# Patient Record
Sex: Male | Born: 1960 | Race: Black or African American | Hispanic: No | Marital: Single | Smoking: Former smoker
Health system: Southern US, Community
[De-identification: ages and names within clinical notes are randomized; demographics above are authoritative.]

## PROBLEM LIST (undated history)

## (undated) DIAGNOSIS — E119 Type 2 diabetes mellitus without complications: Secondary | ICD-10-CM

## (undated) DIAGNOSIS — I10 Essential (primary) hypertension: Secondary | ICD-10-CM

## (undated) DIAGNOSIS — F419 Anxiety disorder, unspecified: Secondary | ICD-10-CM

## (undated) DIAGNOSIS — E785 Hyperlipidemia, unspecified: Secondary | ICD-10-CM

## (undated) DIAGNOSIS — N529 Male erectile dysfunction, unspecified: Secondary | ICD-10-CM

## (undated) HISTORY — PX: NO PAST SURGERIES: SHX2092

## (undated) HISTORY — DX: Essential (primary) hypertension: I10

## (undated) HISTORY — DX: Type 2 diabetes mellitus without complications: E11.9

## (undated) HISTORY — DX: Hyperlipidemia, unspecified: E78.5

## (undated) HISTORY — DX: Anxiety disorder, unspecified: F41.9

## (undated) HISTORY — DX: Male erectile dysfunction, unspecified: N52.9

---

## 2005-08-30 ENCOUNTER — Ambulatory Visit: Payer: Self-pay | Admitting: Family Medicine

## 2006-07-29 ENCOUNTER — Ambulatory Visit: Payer: Self-pay | Admitting: Nurse Practitioner

## 2011-05-16 ENCOUNTER — Emergency Department: Payer: Self-pay | Admitting: Emergency Medicine

## 2012-05-11 ENCOUNTER — Emergency Department: Payer: Self-pay | Admitting: Emergency Medicine

## 2012-05-11 LAB — BASIC METABOLIC PANEL
Anion Gap: 8 (ref 7–16)
BUN: 25 mg/dL — ABNORMAL HIGH (ref 7–18)
Calcium, Total: 9.7 mg/dL (ref 8.5–10.1)
Co2: 26 mmol/L (ref 21–32)
EGFR (African American): >60
EGFR (Non-African Amer.): 59 — ABNORMAL LOW
Glucose: 361 mg/dL — ABNORMAL HIGH (ref 65–99)
Osmolality: 284 (ref 275–301)
Potassium: 3.7 mmol/L (ref 3.5–5.1)

## 2012-05-11 LAB — CBC
HGB: 17.9 g/dL (ref 13.0–18.0)
MCV: 91 fL (ref 80–100)
Platelet: 300 10*3/uL (ref 150–440)
RBC: 5.77 10*6/uL (ref 4.40–5.90)
RDW: 14.1 % (ref 11.5–14.5)
WBC: 7.3 10*3/uL (ref 3.8–10.6)

## 2012-05-24 DIAGNOSIS — K219 Gastro-esophageal reflux disease without esophagitis: Secondary | ICD-10-CM | POA: Insufficient documentation

## 2012-08-24 DIAGNOSIS — F5221 Male erectile disorder: Secondary | ICD-10-CM | POA: Insufficient documentation

## 2012-08-24 DIAGNOSIS — E1142 Type 2 diabetes mellitus with diabetic polyneuropathy: Secondary | ICD-10-CM | POA: Insufficient documentation

## 2013-01-18 DIAGNOSIS — R32 Unspecified urinary incontinence: Secondary | ICD-10-CM | POA: Insufficient documentation

## 2013-01-18 DIAGNOSIS — N529 Male erectile dysfunction, unspecified: Secondary | ICD-10-CM | POA: Insufficient documentation

## 2013-02-22 DIAGNOSIS — R5383 Other fatigue: Secondary | ICD-10-CM | POA: Insufficient documentation

## 2013-02-22 DIAGNOSIS — R5381 Other malaise: Secondary | ICD-10-CM | POA: Insufficient documentation

## 2013-03-10 IMAGING — CR DG CHEST 2V
1 series · 2 of 2 positions shown · non-contrast
Comparison: none

REASON FOR EXAM: dizziness
COMMENTS:

PROCEDURE:     DXR - DXR CHEST PA (OR AP) AND LATERAL  - May 16, 2011  [DATE]
RESULT:     No acute cardiac or pulmonary disease.

[Series 1: view not recorded · 0.17mm/px · 2 of 2 slices shown]
[im 1/2]
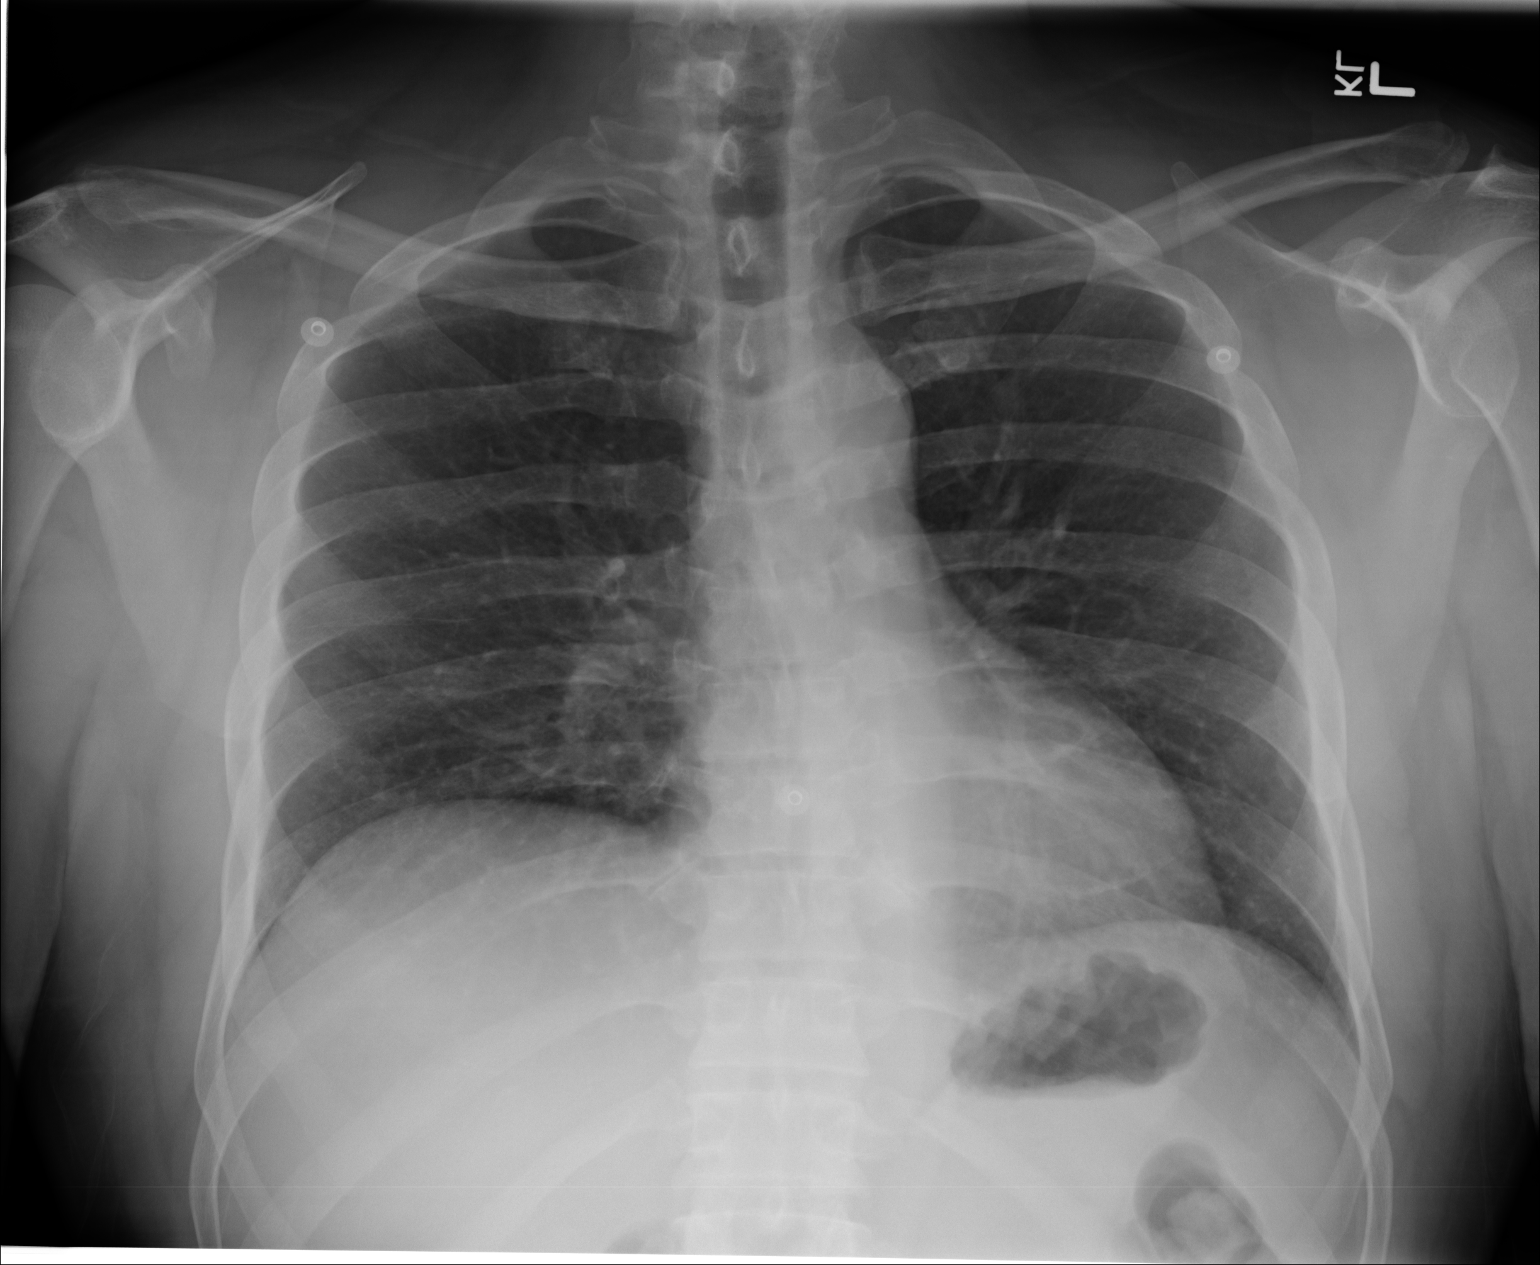
[im 2/2]
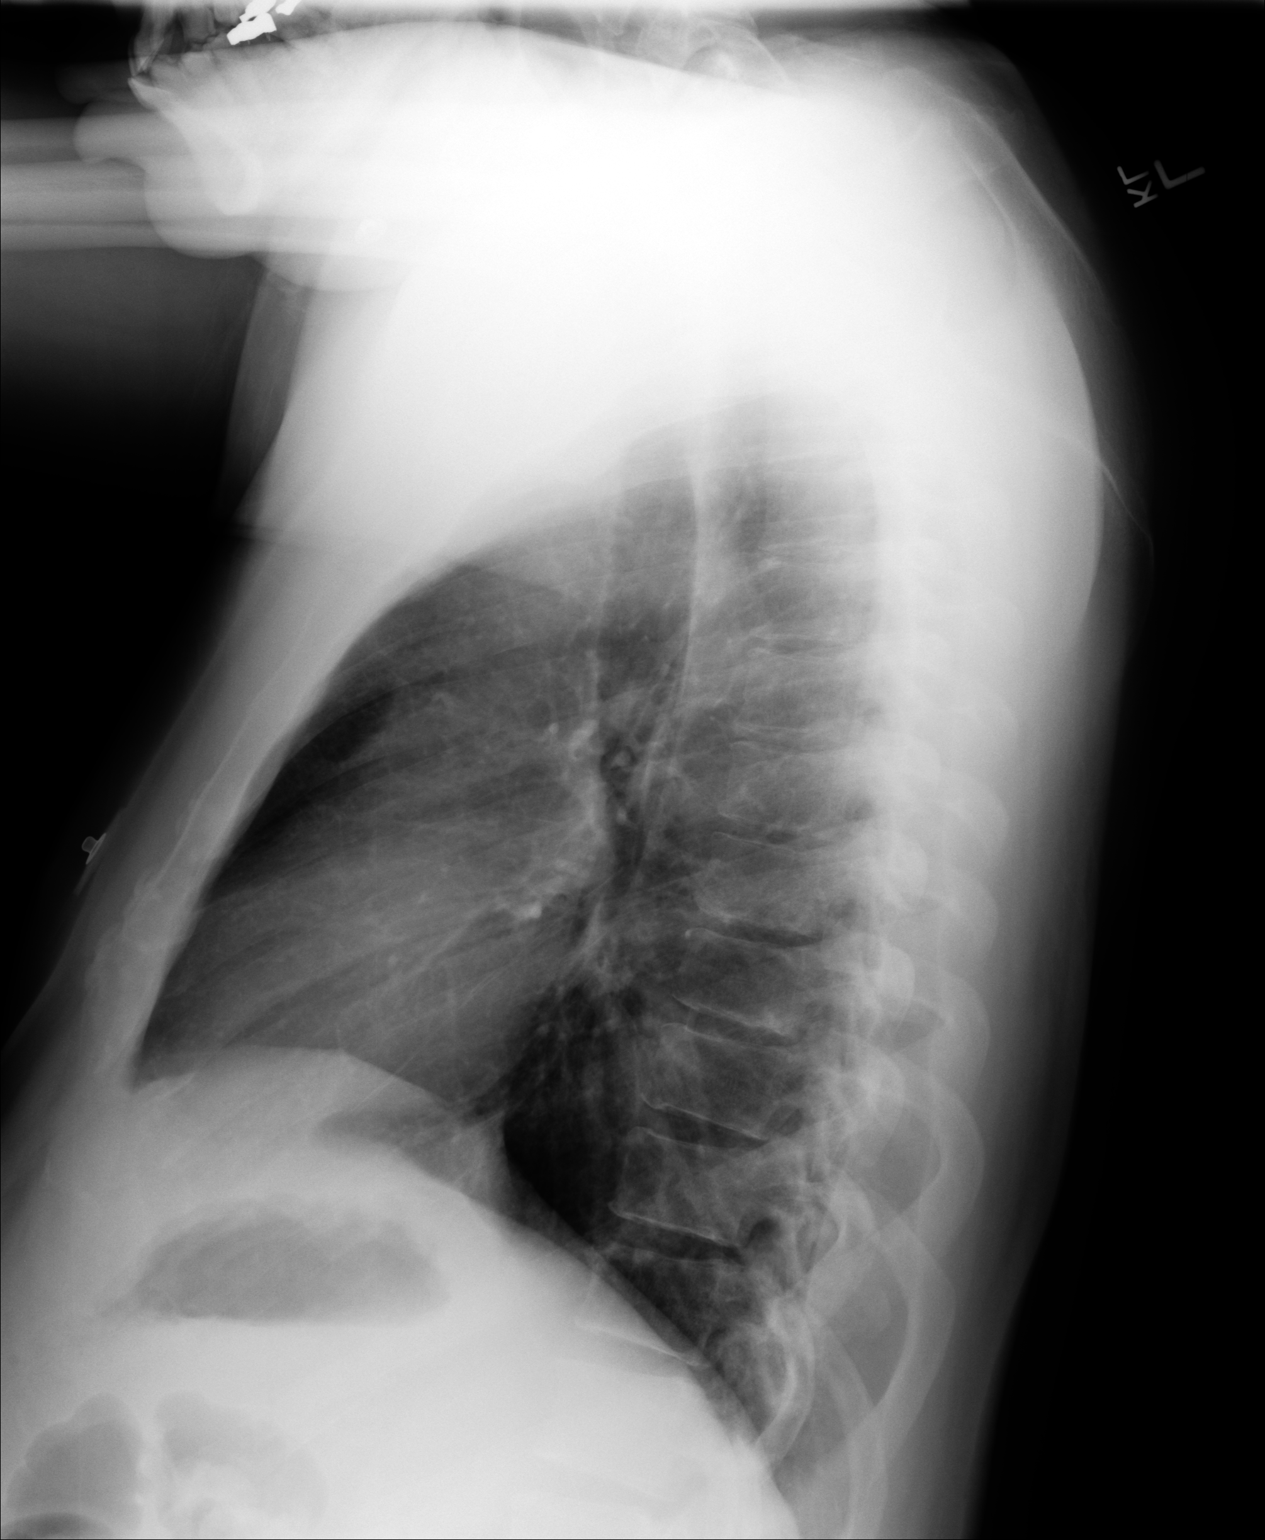

[2 of 2 positions shown; findings below may reference images not displayed]

IMPRESSION: No acute disease.

## 2013-07-06 DIAGNOSIS — E11319 Type 2 diabetes mellitus with unspecified diabetic retinopathy without macular edema: Secondary | ICD-10-CM | POA: Insufficient documentation

## 2013-07-06 DIAGNOSIS — E113299 Type 2 diabetes mellitus with mild nonproliferative diabetic retinopathy without macular edema, unspecified eye: Secondary | ICD-10-CM | POA: Insufficient documentation

## 2014-02-07 DIAGNOSIS — B351 Tinea unguium: Secondary | ICD-10-CM | POA: Insufficient documentation

## 2014-02-07 DIAGNOSIS — M549 Dorsalgia, unspecified: Secondary | ICD-10-CM | POA: Insufficient documentation

## 2016-11-19 ENCOUNTER — Ambulatory Visit (INDEPENDENT_AMBULATORY_CARE_PROVIDER_SITE_OTHER): Payer: Medicare Other | Admitting: Urology

## 2016-11-19 ENCOUNTER — Encounter: Payer: Self-pay | Admitting: Urology

## 2016-11-19 ENCOUNTER — Ambulatory Visit: Payer: Medicare Other | Admitting: Urology

## 2016-11-19 VITALS — BP 150/84 | HR 94 | Ht 69.5 in | Wt 205.3 lb

## 2016-11-19 DIAGNOSIS — N138 Other obstructive and reflux uropathy: Secondary | ICD-10-CM

## 2016-11-19 DIAGNOSIS — N529 Male erectile dysfunction, unspecified: Secondary | ICD-10-CM

## 2016-11-19 DIAGNOSIS — N401 Enlarged prostate with lower urinary tract symptoms: Secondary | ICD-10-CM

## 2016-11-19 DIAGNOSIS — Z125 Encounter for screening for malignant neoplasm of prostate: Secondary | ICD-10-CM

## 2016-11-19 MED ORDER — SILDENAFIL CITRATE 20 MG PO TABS: ORAL_TABLET | ORAL | 3 refills | Status: DC

## 2016-11-19 NOTE — Progress Notes (Signed)
11/19/2016 4:32 PM   Hollie Beach Dec 11, 1960 [id number]  Referring provider: Leanna Sato, MD [address] [place], [place] [address]  Chief Complaint  Patient presents with  . Erectile Dysfunction    HPI: Patient is a 56 year old African American male who is referred to [place] by Dr. Darreld Mclean for erectile dysfunction.  His SHIM score is 5, which is severe ED.  He has been having difficulty with erections for 2 years.   His major complaint is not having erections.  His libido is preserved.   His risk factors for ED are age, BPH, DM, HTN, HLD and blood pressure medications. He denies any painful erections or curvatures with his erections.   He is no longer having spontaneous erections.  He has tried Viagra in the past and found it ineffective.     SHIM    Row Name 11/19/16 1620         SHIM: Over the last 6 months:   How do you rate your confidence that you could get and keep an erection? Very Low     When you had erections with sexual stimulation, how often were your erections hard enough for penetration (entering your partner)? Almost Never or Never     During sexual intercourse, how often were you able to maintain your erection after you had penetrated (entered) your partner? Extremely Difficult     During sexual intercourse, how difficult was it to maintain your erection to completion of intercourse? Extremely Difficult     When you attempted sexual intercourse, how often was it satisfactory for you? Extremely Difficult       SHIM Total Score   SHIM 5        Score: 1-7 Severe ED 8-11 Moderate ED 12-16 Mild-Moderate ED 17-21 Mild ED 22-25 No ED  His IPSS score today is 6, which is mild lower urinary tract symptomatology. He is pleased with his quality life due to his urinary symptoms.  His major complaints today are frequency, urgency and nocturia x 2.  He has had these symptoms for a few years.  He denies any dysuria, hematuria or suprapubic pain.   He also  denies any recent fevers, chills, nausea or vomiting.  He does not have a family history of PCa.     IPSS    Row Name 11/19/16 1600         International Prostate Symptom Score   How often have you had the sensation of not emptying your bladder? Not at All     How often have you had to urinate less than every two hours? Less than 1 in 5 times     How often have you found you stopped and started again several times when you urinated? Not at All     How often have you found it difficult to postpone urination? Less than half the time     How often have you had a weak urinary stream? Less than 1 in 5 times     How often have you had to strain to start urination? Not at All     How many times did you typically get up at night to urinate? 2 Times     Total IPSS Score 6       Quality of Life due to urinary symptoms   If you were to spend the rest of your life with your urinary condition just the way it is now how would you  feel about that? Pleased        Score:  1-7 Mild 8-19 Moderate 20-35 Severe  PMH: Past Medical History:  Diagnosis Date  . Anxiety   . Diabetes mellitus without complication (HCC)   . Hyperlipidemia   . Hypertension     Surgical History: History reviewed. No pertinent surgical history.  Home Medications:  Allergies as of 11/19/2016   No Known Allergies     Medication List       Accurate as of 11/19/16  4:32 PM. Always use your most recent med list.          atorvastatin 40 MG tablet Commonly known as:  LIPITOR   gabapentin 300 MG capsule Commonly known as:  NEURONTIN   hydrochlorothiazide 25 MG tablet Commonly known as:  HYDRODIURIL   LANTUS SOLOSTAR 100 UNIT/ML Solostar Pen Generic drug:  Insulin Glargine   latanoprost 0.005 % ophthalmic solution Commonly known as:  XALATAN   lisinopril 5 MG tablet Commonly known as:  PRINIVIL,ZESTRIL   RELION PEN NEEDLES 31G X 6 MM Misc Generic drug:  Insulin Pen Needle   sildenafil 20 MG  tablet Commonly known as:  REVATIO Take 3 to 5 tablets two hours before intercouse on an empty stomach.  Do not take with nitrates.   triamcinolone cream 0.1 % Commonly known as:  KENALOG       Allergies: No Known Allergies  Family History: Family History  Problem Relation Age of Onset  . Cancer Father     Lung  . Cancer Mother     spinal  . Prostate cancer Neg Hx   . Bladder Cancer Neg Hx   . Kidney cancer Neg Hx     Social History:  reports that he has quit smoking. He has never used smokeless tobacco. He reports that he drinks alcohol. He reports that he does not use drugs.  ROS: UROLOGY Frequent Urination?: Yes Hard to postpone urination?: Yes Burning/pain with urination?: No Get up at night to urinate?: Yes Leakage of urine?: No Urine stream starts and stops?: No Trouble starting stream?: No Do you have to strain to urinate?: No Blood in urine?: No Urinary tract infection?: No Sexually transmitted disease?: No Injury to kidneys or bladder?: No Painful intercourse?: No Weak stream?: No Erection problems?: Yes Penile pain?: No  Gastrointestinal Nausea?: No Vomiting?: No Indigestion/heartburn?: No Diarrhea?: No Constipation?: No  Constitutional Fever: No Night sweats?: No Weight loss?: No Fatigue?: No  Skin Skin rash/lesions?: No Itching?: Yes  Eyes Blurred vision?: Yes Double vision?: No  Ears/Nose/Throat Sore throat?: No Sinus problems?: No  Hematologic/Lymphatic Swollen glands?: No Easy bruising?: No  Cardiovascular Leg swelling?: No Chest pain?: No  Respiratory Cough?: No Shortness of breath?: Yes  Endocrine Excessive thirst?: No  Musculoskeletal Back pain?: Yes Joint pain?: No  Neurological Headaches?: No Dizziness?: No  Psychologic Depression?: No Anxiety?: Yes  Physical Exam: BP (!) 150/84 (BP Location: Left Arm, Patient Position: Sitting, Cuff Size: Normal)   Pulse 94   Ht 5' 9.5" (1.765 m)   Wt 205 lb 4.8  oz (93.1 kg)   BMI 29.88 kg/m   Constitutional: Well nourished. Alert and oriented, No acute distress. HEENT: [place] AT, moist mucus membranes. Trachea midline, no masses. Cardiovascular: No clubbing, cyanosis, or edema. Respiratory: Normal respiratory effort, no increased work of breathing. GI: Abdomen is soft, non tender, non distended, no abdominal masses. Liver and spleen not palpable.  No hernias appreciated.  Stool sample for occult testing is not indicated.  GU: No CVA tenderness.  No bladder fullness or masses.  Patient with circumcised phallus.  Urethral meatus is patent.  No penile discharge. No penile lesions or rashes. Scrotum without lesions, cysts, rashes and/or edema.  Testicles are located scrotally bilaterally. No masses are appreciated in the testicles. Left and right epididymis are normal. Rectal: Patient with  normal sphincter tone. Anus and perineum without scarring or rashes. No rectal masses are appreciated. Prostate is approximately 50 grams, no nodules are appreciated. Seminal vesicles are normal. Skin: No rashes, bruises or suspicious lesions. Lymph: No cervical or inguinal adenopathy. Neurologic: Grossly intact, no focal deficits, moving all 4 extremities. Psychiatric: Normal mood and affect.  Laboratory Data: PSA History   0.3 ng/mL on 11/04/2016  Lab Results  Component Value Date   WBC 7.3 05/11/2012   HGB 17.9 05/11/2012   HCT 52.4 (H) 05/11/2012   MCV 91 05/11/2012   PLT 300 05/11/2012    Lab Results  Component Value Date   CREATININE 1.36 (H) 05/11/2012    Assessment & Plan:    1. Erectile dysfunction  - SHIM score is 5  - I explained to the patient that in order to achieve an erection it takes good functioning of the nervous system (parasympathetic, sympathetic, sensory and motor), good blood flow into the erectile tissue of the penis and a desire to have sex  - I explained that conditions like diabetes, hypertension, coronary artery disease,  peripheral vascular disease, smoking, alcohol consumption, age, sleep apnea and BPH can diminish the ability to have an erection  - We discussed trying a different PDE5 inhibitor, intra-urethral suppositories, intracavernous vasoactive drug injection therapy, vacuum constriction device and penile prosthesis implantation  - He would like to retry PDE5-inhibitors - script given for sildenafil 20 mg, 3 to 5 tablets on an empty stomach two hours prior to intercourse, # 50  - RTC in one month for repeat SHIM score  2. BPH with LUTS  - IPSS score is 6/1  - Continue conservative management, avoiding bladder irritants and timed voiding's  - most bothersome symptoms are frequency, urgency and nocturia  - RTC in 12 months for IPSS, PSA and exam   3. PSA screening  - PSA screening on a yearly basis is appropriate in his age group  Return in about 1 month (around 12/17/2016) for SHIM .  These notes generated with voice recognition software. I apologize for typographical errors.  Michiel Cowboy, PA-C  [facility] [address] [place], [place] [address] [phone]

## 2016-12-18 ENCOUNTER — Ambulatory Visit: Payer: Medicare Other | Admitting: Urology

## 2018-01-01 DIAGNOSIS — M62838 Other muscle spasm: Secondary | ICD-10-CM | POA: Insufficient documentation

## 2018-01-29 DIAGNOSIS — L84 Corns and callosities: Secondary | ICD-10-CM | POA: Insufficient documentation

## 2018-09-08 DIAGNOSIS — M109 Gout, unspecified: Secondary | ICD-10-CM | POA: Insufficient documentation

## 2018-10-06 ENCOUNTER — Encounter: Payer: Self-pay | Admitting: Family Medicine

## 2018-10-07 ENCOUNTER — Telehealth: Payer: Self-pay

## 2018-10-07 NOTE — Telephone Encounter (Signed)
Pt left vm to schedule procedure  °

## 2018-10-08 ENCOUNTER — Other Ambulatory Visit: Payer: Self-pay

## 2018-10-08 DIAGNOSIS — Z1211 Encounter for screening for malignant neoplasm of colon: Secondary | ICD-10-CM

## 2018-10-08 NOTE — Telephone Encounter (Signed)
Colonoscopy has been scheduled for 10/28/18 with Dr. Tobi Bastos at [facility].  Thanks [employer]

## 2018-10-14 ENCOUNTER — Ambulatory Visit (INDEPENDENT_AMBULATORY_CARE_PROVIDER_SITE_OTHER): Payer: Medicare Other | Admitting: Podiatry

## 2018-10-14 ENCOUNTER — Encounter: Payer: Self-pay | Admitting: Podiatry

## 2018-10-14 DIAGNOSIS — E1142 Type 2 diabetes mellitus with diabetic polyneuropathy: Secondary | ICD-10-CM

## 2018-10-14 DIAGNOSIS — Q828 Other specified congenital malformations of skin: Secondary | ICD-10-CM

## 2018-10-14 DIAGNOSIS — M205X9 Other deformities of toe(s) (acquired), unspecified foot: Secondary | ICD-10-CM | POA: Diagnosis not present

## 2018-10-14 DIAGNOSIS — M79674 Pain in right toe(s): Secondary | ICD-10-CM | POA: Diagnosis not present

## 2018-10-14 DIAGNOSIS — M79675 Pain in left toe(s): Secondary | ICD-10-CM

## 2018-10-14 DIAGNOSIS — B351 Tinea unguium: Secondary | ICD-10-CM

## 2018-10-14 NOTE — Progress Notes (Addendum)
This patient presents to the office with chief complaint of long thick nails and diabetic feet. He also has a callus on his right foot which he applied acid to remove his callus.  This patient  says there  is  no pain and discomfort in their feet.  This patient says there are long thick painful nails.  These nails are painful walking and wearing shoes.  Patient has no history of infection or drainage from both feet.  Patient is unable to  self treat his own nails . This patient presents  to the office today for treatment of the  long nails, callus  and a foot evaluation due to history of  Diabetes. Patient has neuropathy. Patient has been under treatment for gout for the last 3 weeks.  General Appearance  Alert, conversant and in no acute stress.  Vascular  Dorsalis pedis and posterior tibial  pulses are weakly  palpable  bilaterally.  Capillary return is within normal limits  bilaterally. Temperature is within normal limits  bilaterally.  Neurologic  Senn-Weinstein monofilament wire test diminished   bilaterally. Muscle power within normal limits bilaterally.  Nails Thick disfigured discolored nails with subungual debris  from hallux to fifth toes bilaterally. No evidence of bacterial infection or drainage bilaterally.  Orthopedic  Limitation of motion 1st MPJ  B/L.Marland Kitchen  No crepitus or effusions noted.  Hallux limitus 1st MPJ  B/L.  Adductovarus fifth digit  B/L.    Skin  normotropic skin  noted bilaterally.  Callus on dorsolateral aspect right foot at the level 5th MPJ.  No signs of infections or ulcers noted.     Onychomycosis  Diabetes with no angiopathy and neuropathy.  IE  Debride nails x 10.  A diabetic foot exam was performed and there is evidence of  vascular or neurologic pathology.  Recommended pumice stones for calluses.   RTC 3 months.   Helane Gunther DPM

## 2018-10-15 ENCOUNTER — Encounter: Payer: Self-pay | Admitting: Podiatry

## 2018-10-15 NOTE — Telephone Encounter (Signed)
Dr. Stacie Acres, I reviewed your note and it doesn't state anything about arthritis, nor gout.  Im not sure if this was discussed during your visit.  Will you please call him or let me now what to tell him and I will call.   Thanks!

## 2018-10-20 ENCOUNTER — Other Ambulatory Visit (INDEPENDENT_AMBULATORY_CARE_PROVIDER_SITE_OTHER): Payer: Self-pay | Admitting: Vascular Surgery

## 2018-10-20 DIAGNOSIS — I739 Peripheral vascular disease, unspecified: Secondary | ICD-10-CM

## 2018-10-21 ENCOUNTER — Ambulatory Visit (INDEPENDENT_AMBULATORY_CARE_PROVIDER_SITE_OTHER): Payer: Medicare Other | Admitting: Vascular Surgery

## 2018-10-21 ENCOUNTER — Encounter (INDEPENDENT_AMBULATORY_CARE_PROVIDER_SITE_OTHER): Payer: Self-pay | Admitting: Vascular Surgery

## 2018-10-21 ENCOUNTER — Ambulatory Visit (INDEPENDENT_AMBULATORY_CARE_PROVIDER_SITE_OTHER): Payer: Medicare Other

## 2018-10-21 DIAGNOSIS — I739 Peripheral vascular disease, unspecified: Secondary | ICD-10-CM | POA: Diagnosis not present

## 2018-10-21 DIAGNOSIS — E1151 Type 2 diabetes mellitus with diabetic peripheral angiopathy without gangrene: Secondary | ICD-10-CM

## 2018-10-21 DIAGNOSIS — M79606 Pain in leg, unspecified: Secondary | ICD-10-CM | POA: Insufficient documentation

## 2018-10-21 DIAGNOSIS — Z794 Long term (current) use of insulin: Secondary | ICD-10-CM

## 2018-10-21 DIAGNOSIS — E782 Mixed hyperlipidemia: Secondary | ICD-10-CM

## 2018-10-21 DIAGNOSIS — M79604 Pain in right leg: Secondary | ICD-10-CM | POA: Diagnosis not present

## 2018-10-21 DIAGNOSIS — M79605 Pain in left leg: Secondary | ICD-10-CM | POA: Diagnosis not present

## 2018-10-21 DIAGNOSIS — E785 Hyperlipidemia, unspecified: Secondary | ICD-10-CM | POA: Insufficient documentation

## 2018-10-21 DIAGNOSIS — I10 Essential (primary) hypertension: Secondary | ICD-10-CM | POA: Insufficient documentation

## 2018-10-21 DIAGNOSIS — E119 Type 2 diabetes mellitus without complications: Secondary | ICD-10-CM | POA: Insufficient documentation

## 2018-10-21 NOTE — Progress Notes (Signed)
MRN : [id number]  Marcus [name] is a 58 y.o. (1961/08/25) male who presents with chief complaint of  Chief Complaint  Patient presents with  . Establish Care  .  History of Present Illness:    The patient is seen for evaluation of painful lower extremities and diminished pulses. Patient notes the pain is always associated with activity and is very consistent day today. Typically, the pain occurs at less than one block, progress is as activity continues to the point that the patient must stop walking. Resting including standing still for several minutes allowed resumption of the activity and the ability to walk a similar distance before stopping again. Uneven terrain and inclined shorten the distance. The pain has been progressive over the past several years. The patient states his difficulty walking is not having a profound negative impact on quality of life and his daily activities at this time.  The patient denies rest pain or dangling of an extremity off the side of the bed during the night for relief. No open wounds or sores at this time. No prior interventions or surgeries.  No history of back problems or DJD of the lumbar sacral spine.   The patient denies changes in claudication symptoms or new rest pain symptoms.  No new ulcers or wounds of the foot.  The patient's blood pressure has been stable and relatively well controlled. The patient denies amaurosis fugax or recent TIA symptoms. There are no recent neurological changes noted. The patient denies history of DVT, PE or superficial thrombophlebitis. The patient denies recent episodes of angina or shortness of breath.   ABI Rt=1.00 and Lt=0.81 Doppler signals are biphasic bilaterally  Current Meds  Medication Sig  . ACCU-CHEK AVIVA PLUS test strip USE STRIP TO CHECK GLUCOSE THREE TIMES DAILY  . ACCU-CHEK FASTCLIX LANCETS MISC USE TO CHECK BLOOD SUGAR THREE TIMES DAILY  . allopurinol (ZYLOPRIM) 100 MG tablet TAKE 1  TABLET BY MOUTH ONCE DAILY TO PREVENT GOUT  . aspirin EC 81 MG tablet Take by mouth.  Marland Kitchen atorvastatin (LIPITOR) 40 MG tablet Take 40 mg by mouth daily at 6 PM.   . furosemide (LASIX) 20 MG tablet TAKE 1 TABLET BY MOUTH IN THE MORNING FOR HIGH BLOOD PRESSURE. TAKE IN PLACE OF HCTZ.  . gabapentin (NEURONTIN) 300 MG capsule Take 300 mg by mouth as needed.   Marland Kitchen LANTUS SOLOSTAR 100 UNIT/ML Solostar Pen Inject into the skin. 31 units  . latanoprost (XALATAN) 0.005 % ophthalmic solution   . lisinopril (PRINIVIL,ZESTRIL) 5 MG tablet   . RELION PEN NEEDLES 31G X 6 MM MISC   . TRESIBA FLEXTOUCH 100 UNIT/ML SOPN FlexTouch Pen   . triamcinolone cream (KENALOG) 0.1 %     Past Medical History:  Diagnosis Date  . Anxiety   . Diabetes mellitus without complication (HCC)   . Hyperlipidemia   . Hypertension     No past surgical history on file.  Social History Social History   Tobacco Use  . Smoking status: Former Games developer  . Smokeless tobacco: Never Used  Substance Use Topics  . Alcohol use: Yes  . Drug use: No    Family History Family History  Problem Relation Age of Onset  . Cancer Father        Lung  . Cancer Mother        spinal  . Prostate cancer Neg Hx   . Bladder Cancer Neg Hx   . Kidney cancer Neg Hx   No  family history of bleeding/clotting disorders, porphyria or autoimmune disease   No Known Allergies   REVIEW OF SYSTEMS (Negative unless checked)  Constitutional: [] Weight loss  [] Fever  [] Chills Cardiac: [] Chest pain   [] Chest pressure   [] Palpitations   [] Shortness of breath when laying flat   [] Shortness of breath with exertion. Vascular:  [x] Pain in legs with walking   [] Pain in legs at rest  [] History of DVT   [] Phlebitis   [] Swelling in legs   [] Varicose veins   [] Non-healing ulcers Pulmonary:   [] Uses home oxygen   [] Productive cough   [] Hemoptysis   [] Wheeze  [] COPD   [] Asthma Neurologic:  [] Dizziness   [] Seizures   [] History of stroke   [] History of TIA  [] Aphasia    [] Vissual changes   [] Weakness or numbness in arm   [] Weakness or numbness in leg Musculoskeletal:   [] Joint swelling   [] Joint pain   [] Low back pain Hematologic:  [] Easy bruising  [] Easy bleeding   [] Hypercoagulable state   [] Anemic Gastrointestinal:  [] Diarrhea   [] Vomiting  [] Gastroesophageal reflux/heartburn   [] Difficulty swallowing. Genitourinary:  [] Chronic kidney disease   [] Difficult urination  [] Frequent urination   [] Blood in urine Skin:  [] Rashes   [] Ulcers  Psychological:  [] History of anxiety   []  History of major depression.  Physical Examination  Vitals:   10/21/18 0949  BP: (!) 144/84  Pulse: 92  Resp: 16  Weight: 206 lb 9.6 oz (93.7 kg)  Height: 5' 9.5" (1.765 m)   Body mass index is 30.07 kg/m. Gen: WD/WN, NAD Head: [place]/AT, No temporalis wasting.  Ear/Nose/Throat: Hearing grossly intact, nares w/o erythema or drainage, poor dentition Eyes: PER, EOMI, sclera nonicteric.  Neck: Supple, no masses.  No bruit or JVD.  Pulmonary:  Good air movement, clear to auscultation bilaterally, no use of accessory muscles.  Cardiac: RRR, normal S1, S2, no Murmurs. Vascular:  Vessel Right Left  Radial Palpable Palpable  Brachial Palpable Palpable  Popliteal Not Palpable Not Palpable  PT Not Palpable Not Palpable  DP Not Palpable Not Palpable  Gastrointestinal: soft, non-distended. No guarding/no peritoneal signs.  Musculoskeletal: M/S 5/5 throughout.  No deformity or atrophy.  Neurologic: CN 2-12 intact. Pain and light touch intact in extremities.  Symmetrical.  Speech is fluent. Motor exam as listed above. Psychiatric: Judgment intact, Mood & affect appropriate for pt's clinical situation. Dermatologic: No rashes or ulcers noted.  No changes consistent with cellulitis. Lymph : No Cervical lymphadenopathy, no lichenification or skin changes of chronic lymphedema.  CBC Lab Results  Component Value Date   WBC 7.3 05/11/2012   HGB 17.9 05/11/2012   HCT 52.4 (H)  05/11/2012   MCV 91 05/11/2012   PLT 300 05/11/2012    BMET    Component Value Date/Time   NA 132 (L) 05/11/2012 1137   K 3.7 05/11/2012 1137   CL 98 05/11/2012 1137   CO2 26 05/11/2012 1137   GLUCOSE 361 (H) 05/11/2012 1137   BUN 25 (H) 05/11/2012 1137   CREATININE 1.36 (H) 05/11/2012 1137   CALCIUM 9.7 05/11/2012 1137   GFRNONAA 59 (L) 05/11/2012 1137   GFRAA >60 05/11/2012 1137   CrCl cannot be calculated (Patient's most recent lab result is older than the maximum 21 days allowed.).  COAG No results found for: INR, PROTIME  Radiology No results found.   Assessment/Plan 1. Pain in both lower extremities  Recommend:  The patient has evidence of atherosclerosis of the lower extremities with claudication.  The patient  does not voice lifestyle limiting changes at this point in time.  Noninvasive studies do not suggest clinically significant change.  No invasive studies, angiography or surgery at this time The patient should continue walking and begin a more formal exercise program.  The patient should continue antiplatelet therapy and aggressive treatment of the lipid abnormalities  No changes in the patient's medications at this time  - VAS [place] ABI WITH/WO TBI; Future  2. PAD (peripheral artery disease) (HCC)  Recommend:  The patient has evidence of atherosclerosis of the lower extremities with claudication.  The patient does not voice lifestyle limiting changes at this point in time.  Noninvasive studies do not suggest clinically significant change.  No invasive studies, angiography or surgery at this time The patient should continue walking and begin a more formal exercise program.  The patient should continue antiplatelet therapy and aggressive treatment of the lipid abnormalities  No changes in the patient's medications at this time  - VAS [place] ABI WITH/WO TBI; Future  3. Type 2 diabetes mellitus with diabetic peripheral angiopathy without gangrene, with  long-term current use of insulin (HCC) Continue hypoglycemic medications as already ordered, these medications have been reviewed and there are no changes at this time.  Hgb A1C to be monitored as already arranged by primary service   4. Mixed hyperlipidemia Continue statin as ordered and reviewed, no changes at this time   5. Essential hypertension Continue antihypertensive medications as already ordered, these medications have been reviewed and there are no changes at this time.    Levora Dredge, MD  10/21/2018 9:58 AM

## 2018-10-26 ENCOUNTER — Telehealth: Payer: Self-pay | Admitting: Gastroenterology

## 2018-10-26 NOTE — Telephone Encounter (Signed)
Patient called this am to cancel his appointment for his colonoscopy with Dr Tobi Bastos on 10-28-2018. He did leave a message on Monday evening @ 6:14pm on our machine to cancel appointment. He will call to r/s

## 2018-10-26 NOTE — Telephone Encounter (Signed)
Returned patients call to cancel colonoscopy.  Asked if he would like to reschedule he said at a later time due to school.  He will call back. Trish in Endoscopy has been informed to cancel procedure.  Referral has been marked and noted as canceled.  Thanks [employer]

## 2018-10-28 ENCOUNTER — Ambulatory Visit: Admission: RE | Admit: 2018-10-28 | Payer: Medicare Other | Source: Home / Self Care | Admitting: Gastroenterology

## 2018-10-28 ENCOUNTER — Encounter: Admission: RE | Payer: Self-pay | Source: Home / Self Care

## 2018-10-28 SURGERY — COLONOSCOPY WITH PROPOFOL
Anesthesia: General

## 2019-02-02 ENCOUNTER — Encounter (INDEPENDENT_AMBULATORY_CARE_PROVIDER_SITE_OTHER): Payer: Self-pay

## 2019-05-31 ENCOUNTER — Ambulatory Visit: Payer: Medicare Other | Admitting: Urology

## 2019-06-16 ENCOUNTER — Ambulatory Visit: Payer: Medicare Other | Admitting: Urology

## 2019-07-07 ENCOUNTER — Encounter: Payer: Self-pay | Admitting: Urology

## 2019-07-07 ENCOUNTER — Other Ambulatory Visit: Payer: Self-pay

## 2019-07-07 ENCOUNTER — Ambulatory Visit (INDEPENDENT_AMBULATORY_CARE_PROVIDER_SITE_OTHER): Payer: Medicare Other | Admitting: Urology

## 2019-07-07 VITALS — BP 149/88 | HR 109 | Ht 69.5 in | Wt 210.1 lb

## 2019-07-07 DIAGNOSIS — N401 Enlarged prostate with lower urinary tract symptoms: Secondary | ICD-10-CM | POA: Diagnosis not present

## 2019-07-07 DIAGNOSIS — N5201 Erectile dysfunction due to arterial insufficiency: Secondary | ICD-10-CM | POA: Diagnosis not present

## 2019-07-08 ENCOUNTER — Encounter: Payer: Self-pay | Admitting: Urology

## 2019-07-08 DIAGNOSIS — E559 Vitamin D deficiency, unspecified: Secondary | ICD-10-CM | POA: Insufficient documentation

## 2019-07-08 DIAGNOSIS — Z0000 Encounter for general adult medical examination without abnormal findings: Secondary | ICD-10-CM | POA: Insufficient documentation

## 2019-07-08 NOTE — Progress Notes (Signed)
07/07/2019 7:20 AM   Marcus [name] 1960-11-26 [id number]  Referring provider: Marguerita Merles, [address] [place] [place],  [place] [address]  Chief Complaint  Patient presents with  . Erectile Dysfunction    HPI: 58 y.o. male who saw Marcus [name] in February 2018 for erectile dysfunction.  He had significant organic risk factors and severe ED.  After discussing treatment options he elected a trial of PDE 5 inhibitor and was given an Rx for generic sildenafil which he states was not effective.  A 1 month follow-up was recommended however he has not followed up until now.  SHIM today is 5/25 indicating severe ED.  Denies pain or curvature with erections.  He has good libido.  He has mild to moderate lower urinary tract symptoms which are not bothersome.  IPSS completed today was 8/35 with quality-of-life rated 1/6.  Denies dysuria or gross hematuria.  He has no flank, abdominal, pelvic or scrotal pain.  PMH: Past Medical History:  Diagnosis Date  . Anxiety   . Diabetes mellitus without complication ([place])   . ED (erectile dysfunction)   . Hyperlipidemia   . Hypertension     Surgical History: No past surgical history on file.  Home Medications:  Allergies as of 07/07/2019   No Known Allergies     Medication List       Accurate as of July 07, 2019 11:59 PM. If you have any questions, ask your nurse or doctor.        Accu-Chek Aviva Plus test strip Generic drug: glucose blood USE STRIP TO CHECK GLUCOSE THREE TIMES DAILY   Accu-Chek FastClix Lancets Misc USE TO CHECK BLOOD SUGAR THREE TIMES DAILY   allopurinol 100 MG tablet Commonly known as: ZYLOPRIM TAKE 1 TABLET BY MOUTH ONCE DAILY TO PREVENT GOUT   aspirin EC 81 MG tablet Take by mouth.   atorvastatin 40 MG tablet Commonly known as: LIPITOR Take 40 mg by mouth daily at 6 PM.   diclofenac 75 MG EC tablet Commonly known as: VOLTAREN TAKE 1 TABLET BY MOUTH TWICE DAILY WITH FOOD AS NEEDED FOR PAIN   furosemide 20 MG tablet Commonly known as: LASIX TAKE 1 TABLET BY MOUTH IN THE MORNING FOR HIGH BLOOD PRESSURE. TAKE IN PLACE OF HCTZ.   gabapentin 300 MG capsule Commonly known as: NEURONTIN Take 300 mg by mouth as needed.   Lantus SoloStar 100 UNIT/ML Solostar Pen Generic drug: Insulin Glargine Inject into the skin. 31 units   latanoprost 0.005 % ophthalmic solution Commonly known as: XALATAN   lisinopril 5 MG tablet Commonly known as: ZESTRIL   ReliOn Pen Needles 31G X 6 MM Misc Generic drug: Insulin Pen Needle   sildenafil 20 MG tablet Commonly known as: REVATIO Take 3 to 5 tablets two hours before intercouse on an empty stomach.  Do not take with nitrates.   Tradjenta 5 MG Tabs tablet Generic drug: linagliptin TAKE 1 TABLET BY MOUTH ONCE DAILY FOR DIABETES   Tresiba FlexTouch 100 UNIT/ML Sopn FlexTouch Pen Generic drug: insulin degludec   triamcinolone cream 0.1 % Commonly known as: KENALOG       Allergies: No Known Allergies  Family History: Family History  Problem Relation Age of Onset  . Cancer Father        Lung  . Cancer Mother        spinal  . Prostate cancer Neg Hx   . Bladder Cancer Neg Hx   . Kidney cancer Neg Hx  Social History:  reports that he has quit smoking. He has never used smokeless tobacco. He reports current alcohol use. He reports that he does not use drugs.  ROS: UROLOGY Frequent Urination?: No Hard to postpone urination?: No Burning/pain with urination?: No Get up at night to urinate?: No Leakage of urine?: No Urine stream starts and stops?: No Trouble starting stream?: No Do you have to strain to urinate?: No Blood in urine?: No Urinary tract infection?: No Sexually transmitted disease?: No Injury to kidneys or bladder?: No Painful intercourse?: No Weak stream?: No Erection problems?: Yes Penile pain?: No  Gastrointestinal Nausea?: No Vomiting?: No Indigestion/heartburn?: Yes Diarrhea?: No Constipation?:  No  Constitutional Fever: No Night sweats?: No Weight loss?: No Fatigue?: No  Skin Skin rash/lesions?: No Itching?: No  Eyes Blurred vision?: Yes Double vision?: No  Ears/Nose/Throat Sore throat?: No Sinus problems?: No  Hematologic/Lymphatic Swollen glands?: No Easy bruising?: No  Cardiovascular Leg swelling?: No Chest pain?: No  Respiratory Cough?: No Shortness of breath?: No  Endocrine Excessive thirst?: No  Musculoskeletal Back pain?: Yes Joint pain?: No  Neurological Headaches?: No Dizziness?: No  Psychologic Depression?: No Anxiety?: Yes  Physical Exam: BP (!) 149/88 (BP Location: Left Arm, Patient Position: Sitting, Cuff Size: Normal)   Pulse (!) 109   Ht 5' 9.5" (1.765 m)   Wt 210 lb 1.6 oz (95.3 kg)   BMI 30.58 kg/m   Constitutional:  Alert and oriented, No acute distress. HEENT:  AT, moist mucus membranes.  Trachea midline, no masses. Cardiovascular: No clubbing, cyanosis, or edema. Respiratory: Normal respiratory effort, no increased work of breathing. Lymph: No cervical or inguinal lymphadenopathy. Skin: No rashes, bruises or suspicious lesions. Neurologic: Grossly intact, no focal deficits, moving all 4 extremities. Psychiatric: Normal mood and affect.   Assessment & Plan:    - Erectile dysfunction He has severe ED.  He has tried PDE 5 inhibitors on 2 different occasions which have not been effective.  He was formed is unlikely alternative PDE 5 inhibitors will be effective.  I discussed intracavernosal injections and vacuum erection devices as well as penile implant surgery.  He was interested in pursuing intracavernosal injection therapy however he is unable to get to [facility] to pick up Trimix.  He will check and see if he can arrange transportation or see if the medication can be shipped to him.  He indicated he would call back if he desires to pursue.   Riki Altes, MD  [facility] [address], [address] [place], [place] [address] [phone]

## 2019-08-18 ENCOUNTER — Encounter: Payer: Self-pay | Admitting: Podiatry

## 2019-08-18 ENCOUNTER — Encounter (INDEPENDENT_AMBULATORY_CARE_PROVIDER_SITE_OTHER): Payer: Self-pay

## 2019-10-17 ENCOUNTER — Encounter (INDEPENDENT_AMBULATORY_CARE_PROVIDER_SITE_OTHER): Payer: Self-pay

## 2019-10-24 ENCOUNTER — Encounter (INDEPENDENT_AMBULATORY_CARE_PROVIDER_SITE_OTHER): Payer: Medicare Other

## 2019-10-24 ENCOUNTER — Ambulatory Visit (INDEPENDENT_AMBULATORY_CARE_PROVIDER_SITE_OTHER): Payer: Medicare Other | Admitting: Vascular Surgery

## 2019-11-08 ENCOUNTER — Other Ambulatory Visit: Payer: Self-pay

## 2019-11-08 NOTE — Telephone Encounter (Signed)
Patient called stating he would like to try a script for Viagra, can this be sent in for him?

## 2019-11-10 MED ORDER — SILDENAFIL CITRATE 20 MG PO TABS: ORAL_TABLET | ORAL | 5 refills | Status: DC

## 2019-11-10 NOTE — Telephone Encounter (Signed)
It looks like he was previously on the 20 mg dose that was filled at [facility].  That can be refilled or can send in Rx to [facility] for the 100 mg tabs.  The price there is $10 for 10 tabs and $16 for 30 tabs.

## 2019-11-10 NOTE — Telephone Encounter (Signed)
Patient notified script sent to [facility]

## 2019-11-10 NOTE — Telephone Encounter (Signed)
Left patient message to call 

## 2019-12-02 ENCOUNTER — Encounter: Payer: Self-pay | Admitting: Urology

## 2019-12-05 ENCOUNTER — Telehealth: Payer: Self-pay | Admitting: *Deleted

## 2019-12-05 NOTE — Telephone Encounter (Signed)
appointment with Carollee Herter for injection training and discussion of Edex pricing

## 2019-12-16 ENCOUNTER — Ambulatory Visit: Payer: Self-pay | Admitting: Urology

## 2020-01-12 ENCOUNTER — Other Ambulatory Visit: Payer: Self-pay

## 2020-01-12 ENCOUNTER — Encounter: Payer: Self-pay | Admitting: Emergency Medicine

## 2020-01-12 ENCOUNTER — Emergency Department
Admission: EM | Admit: 2020-01-12 | Discharge: 2020-01-12 | Disposition: A | Discharge status: Home / Self Care | Payer: Medicare Other | Attending: Emergency Medicine | Admitting: Emergency Medicine

## 2020-01-12 ENCOUNTER — Encounter: Payer: Self-pay | Admitting: Urology

## 2020-01-12 ENCOUNTER — Ambulatory Visit (INDEPENDENT_AMBULATORY_CARE_PROVIDER_SITE_OTHER): Payer: Medicare Other | Admitting: Urology

## 2020-01-12 VITALS — BP 192/115 | HR 85 | Ht 69.5 in | Wt 212.0 lb

## 2020-01-12 DIAGNOSIS — I10 Essential (primary) hypertension: Secondary | ICD-10-CM

## 2020-01-12 DIAGNOSIS — E119 Type 2 diabetes mellitus without complications: Secondary | ICD-10-CM | POA: Insufficient documentation

## 2020-01-12 DIAGNOSIS — Z79899 Other long term (current) drug therapy: Secondary | ICD-10-CM | POA: Insufficient documentation

## 2020-01-12 DIAGNOSIS — Z7982 Long term (current) use of aspirin: Secondary | ICD-10-CM | POA: Diagnosis not present

## 2020-01-12 DIAGNOSIS — Z794 Long term (current) use of insulin: Secondary | ICD-10-CM | POA: Insufficient documentation

## 2020-01-12 DIAGNOSIS — N5201 Erectile dysfunction due to arterial insufficiency: Secondary | ICD-10-CM | POA: Diagnosis not present

## 2020-01-12 DIAGNOSIS — Z87891 Personal history of nicotine dependence: Secondary | ICD-10-CM | POA: Insufficient documentation

## 2020-01-12 LAB — BASIC METABOLIC PANEL
Anion gap: 11 (ref 5–15)
BUN: 29 mg/dL — ABNORMAL HIGH (ref 6–20)
CO2: 23 mmol/L (ref 22–32)
Calcium: 9.5 mg/dL (ref 8.9–10.3)
Chloride: 102 mmol/L (ref 98–111)
Creatinine, Ser: 1.42 mg/dL — ABNORMAL HIGH (ref 0.61–1.24)
GFR calc Af Amer: >60 mL/min (ref >60)
GFR calc non Af Amer: 53 mL/min — ABNORMAL LOW (ref >60)
Glucose, Bld: 171 mg/dL — ABNORMAL HIGH (ref 70–99)
Potassium: 3.9 mmol/L (ref 3.5–5.1)
Sodium: 136 mmol/L (ref 135–145)

## 2020-01-12 LAB — CBC
HCT: 51.8 % (ref 39.0–52.0)
Hemoglobin: 17.5 g/dL — ABNORMAL HIGH (ref 13.0–17.0)
MCH: 30.2 pg (ref 26.0–34.0)
MCHC: 33.8 g/dL (ref 30.0–36.0)
MCV: 89.3 fL (ref 80.0–100.0)
Platelets: 248 10*3/uL (ref 150–400)
RBC: 5.8 MIL/uL (ref 4.22–5.81)
RDW: 15 % (ref 11.5–15.5)
WBC: 5.8 10*3/uL (ref 4.0–10.5)
nRBC: 0 % (ref 0.0–0.2)

## 2020-01-12 LAB — TROPONIN I (HIGH SENSITIVITY): Troponin I (High Sensitivity): 8 ng/L (ref <18)

## 2020-01-12 NOTE — ED Provider Notes (Signed)
[facility] Emergency Department Provider Note   ____________________________________________   First MD Initiated Contact with Patient 01/12/20 1121     (approximate)  I have reviewed the triage vital signs and the nursing notes.   HISTORY  Chief Complaint Hypertension    HPI Marcus [name] is a 59 y.o. male with past medical history of hypertension, hyperlipidemia, and diabetes who presents to the ED complaining of elevated blood pressure.  Patient reports that he was being seen at his urologist office today for erectile dysfunction when they noted that his blood pressure was elevated, so he was sent to the ED for further evaluation.  Patient reports that his blood pressure has been running high lately and he was seen by his PCP for this problem 3 days ago, when his lisinopril dose was increased from 5 mg to 20 mg.  He has started taking this increased dose this morning, denies taking any other medications for his blood pressure.  He has been feeling well and denies any fevers, chills, cough, chest pain, shortness of breath, or difficulty urinating.        Past Medical History:  Diagnosis Date  . Anxiety   . Diabetes mellitus without complication (HCC)   . ED (erectile dysfunction)   . Hyperlipidemia   . Hypertension     Patient Active Problem List   Diagnosis Date Noted  . Leg pain 10/21/2018  . PAD (peripheral artery disease) (HCC) 10/21/2018  . Diabetes (HCC) 10/21/2018  . Hyperlipidemia 10/21/2018  . Essential hypertension 10/21/2018  . Other malaise and fatigue 02/22/2013  . ED (erectile dysfunction) of organic origin 01/18/2013  . Incontinence of urine 01/18/2013    No past surgical history on file.  Prior to Admission medications   Medication Sig Start Date End Date Taking? Authorizing Provider  ACCU-CHEK AVIVA PLUS test strip USE STRIP TO CHECK GLUCOSE THREE TIMES DAILY 09/29/18   [provider]  ACCU-CHEK FASTCLIX LANCETS  MISC USE TO CHECK BLOOD SUGAR THREE TIMES DAILY 09/30/18   [provider]  allopurinol (ZYLOPRIM) 100 MG tablet TAKE 1 TABLET BY MOUTH ONCE DAILY TO PREVENT GOUT 09/16/18   [provider]  aspirin EC 81 MG tablet Take by mouth.    [provider]  atorvastatin (LIPITOR) 40 MG tablet Take 40 mg by mouth daily at 6 PM.  12/24/12   [provider]  diclofenac (VOLTAREN) 75 MG EC tablet TAKE 1 TABLET BY MOUTH TWICE DAILY WITH FOOD AS NEEDED FOR PAIN 10/07/18   [provider]  furosemide (LASIX) 20 MG tablet TAKE 1 TABLET BY MOUTH IN THE MORNING FOR HIGH BLOOD PRESSURE. TAKE IN PLACE OF HCTZ. 09/16/18   [provider]  gabapentin (NEURONTIN) 300 MG capsule Take 300 mg by mouth as needed.  01/12/13   [provider]  LANTUS SOLOSTAR 100 UNIT/ML Solostar Pen Inject into the skin. 31 units 10/07/16   [provider]  latanoprost (XALATAN) 0.005 % ophthalmic solution  11/18/16   [provider]  lisinopril (PRINIVIL,ZESTRIL) 5 MG tablet 20 mg.  11/02/16   [provider]  RELION PEN NEEDLES 31G X 6 MM MISC  11/02/16   [provider]  sildenafil (REVATIO) 20 MG tablet Take 3 to 5 tablets two hours before intercouse on an empty stomach.  Do not take with nitrates. 11/10/19   Stoioff, Verna Czech, MD  TRADJENTA 5 MG TABS tablet TAKE 1 TABLET BY MOUTH ONCE DAILY FOR DIABETES 06/05/19  [provider]  TRESIBA FLEXTOUCH 100 UNIT/ML SOPN FlexTouch Pen  10/11/18   [provider]  triamcinolone cream (KENALOG) 0.1 %  10/30/16   [provider]    Allergies Patient has no known allergies.  Family History  Problem Relation Age of Onset  . Cancer Father        Lung  . Cancer Mother        spinal  . Prostate cancer Neg Hx   . Bladder Cancer Neg Hx   . Kidney cancer Neg Hx     Social History Social History   Tobacco Use  . Smoking status: Former Games developer  . Smokeless tobacco: Never Used    Substance Use Topics  . Alcohol use: Yes  . Drug use: No    Review of Systems  Constitutional: No fever/chills.  Positive for elevated blood pressure. Eyes: No visual changes. ENT: No sore throat. Cardiovascular: Denies chest pain. Respiratory: Denies shortness of breath. Gastrointestinal: No abdominal pain.  No nausea, no vomiting.  No diarrhea.  No constipation. Genitourinary: Negative for dysuria. Musculoskeletal: Negative for back pain. Skin: Negative for rash. Neurological: Negative for headaches, focal weakness or numbness.  ____________________________________________   PHYSICAL EXAM:  VITAL SIGNS: ED Triage Vitals  Enc Vitals Group     BP 01/12/20 0928 (!) 196/107     Pulse Rate 01/12/20 0928 85     Resp 01/12/20 0928 20     Temp 01/12/20 0928 98.4 F (36.9 C)     Temp Source 01/12/20 0928 Oral     SpO2 01/12/20 0928 99 %     Weight 01/12/20 0929 212 lb 1.3 oz (96.2 kg)     Height 01/12/20 0929 5' 9.5" (1.765 m)     Head Circumference --      Peak Flow --      Pain Score 01/12/20 0928 0     Pain Loc --      Pain Edu? --      Excl. in GC? --     Constitutional: Alert and oriented. Eyes: Conjunctivae are normal. Head: Atraumatic. Nose: No congestion/rhinnorhea. Mouth/Throat: Mucous membranes are moist. Neck: Normal ROM Cardiovascular: Normal rate, regular rhythm. Grossly normal heart sounds. Respiratory: Normal respiratory effort.  No retractions. Lungs CTAB. Gastrointestinal: Soft and nontender. No distention. Genitourinary: deferred Musculoskeletal: No lower extremity tenderness nor edema. Neurologic:  Normal speech and language. No gross focal neurologic deficits are appreciated. Skin:  Skin is warm, dry and intact. No rash noted. Psychiatric: Mood and affect are normal. Speech and behavior are normal.  ____________________________________________   LABS (all labs ordered are listed, but only abnormal results are displayed)  Labs Reviewed   BASIC METABOLIC PANEL - Abnormal; Notable for the following components:      Result Value   Glucose, Bld 171 (*)    BUN 29 (*)    Creatinine, Ser 1.42 (*)    GFR calc non Af Amer 53 (*)    All other components within normal limits  CBC - Abnormal; Notable for the following components:   Hemoglobin 17.5 (*)    All other components within normal limits  TROPONIN I (HIGH SENSITIVITY)  TROPONIN I (HIGH SENSITIVITY)   ____________________________________________  EKG  ED ECG REPORT I, Chesley Noon, the attending physician, personally viewed and interpreted this ECG.   Date: 01/12/2020  EKG Time: 9:36  Rate: 89  Rhythm: normal sinus rhythm  Axis: Normal  Intervals:none  ST&T Change: None   PROCEDURES  Procedure(s) performed (  including Critical Care):  Procedures   ____________________________________________   INITIAL IMPRESSION / ASSESSMENT AND PLAN / ED COURSE       59 year old male with history of hypertension presents to the ED for asymptomatic elevated blood pressure noted at his urologist office.  Patient is well-appearing and there is no evidence of hypertensive emergency.  EKG shows no evidence of arrhythmia or ischemia and lab work is unremarkable.  He had just started his increased dose of lisinopril earlier today and I would avoid any further changes until this has a chance to take effect.  He was counseled to continue his increased dose and follow-up with his PCP, otherwise is appropriate for discharge home.  Patient agrees with plan.      ____________________________________________   FINAL CLINICAL IMPRESSION(S) / ED DIAGNOSES  Final diagnoses:  Essential hypertension     ED Discharge Orders    None       Note:  This document was prepared using Dragon voice recognition software and may include unintentional dictation errors.   Blake Divine, MD 01/12/20 [phone]

## 2020-01-12 NOTE — Patient Instructions (Signed)
Please work with your primary care provider to get your blood pressure until control.  We need your blood pressure to 140/90 or less to begin titration.           TRIMIX SELF-INJECTION INSTRUCTIONS    DETAILED PROCEDURE  1. GETTING SET UP  A. Proper hygiene is important. Wash your hands and keep the penis clean.  B. Assemble the following:  - Bottle of Trimix  - Alcohol pad  - Syringe  C. Keep the Trimix cold by returning the bottle to the refrigerator, or by placing the bottle in a cup of ice.   2. PREPARE THE SYRINGE  A. Wipe the rubber top of the vial with an alcohol pad.  B. After removing the cap of the needle, pull the plunger back to the desired dosage, filling this volume with air. Use a new needle and syringe each time.  C. Insert the needle through the rubber top and inject the air into the vial.  D. Turn the vial with needle and syringe inserted upside down. Pull back on the syringe plunger in a slow and steady motion until the desired dosage is achieved.  E. Tap the side of the syringe (1cc tuberculin syringe with a 29 gauge needle) to allow any air bubbles to float towards the needle. Avoid having these air bubbles in the syringe when self-injecting by first injecting out the collected bubbles that may form.  F. Remove the needle from the bottle and replace the protective cap on the needle.    3. SELECT AND PREPARE THE SITE FOR INJECTION  A. The proper location for injection is at the 9-11 and 1-3 o'clock positions, between the base and mid-portion of the penis.(see diagram) Avoid the midline because of potential for injury to the urethra (6 o'clock; for urinary passage) and the penile arteries and nerves (near 12 o'clock). Avoid any visible veins or arteries on the surface.  B. Grasp and pull the head of the penis toward the side of your leg with the index finger and thumb (use the left hand, if right handed). While maintaining light tension, select a site for injection.   C. Clean the site with an alcohol pad.   4: INJECT TRIMIX AND APPLY COMPRESSION  A. With a steady and continuous motion, penetrate the skin with the needle at a 90 o angle. The needle should then be advanced to the hub. Slight resistance is encountered as the needle passes into the proper position within the erectile tissue (corporeal body).  B. Inject the Trimix over approximately 4 seconds. Withdraw the needle from the penis and apply compression to the injection site for approximately 1 minute. Several minutes of compression may be required to avoid bleeding, especially if you are an aspirin user.  C. Replace the cap on the needle and dispose of properly.

## 2020-01-12 NOTE — Progress Notes (Signed)
Mr. Marcus [name] presents today for a discussion regarding intracavernousal injections.    He is no longer spontaneous erections.  He has had ED for four years.  He did not have painful erections or curvature with his erections.  He denies any history of sickle cell anemia or trait, a history of multiple myeloma or a history of leukemia.  He has not taken trazodone or a PDE5i's today.   Upon presentation to our office today, his blood pressure was found to be 192/100.  He states he had seen his primary care provider and his lisinopril was increased from 5 mg daily to 20 mg daily on Monday.  His repeated blood pressure is 205/111.  We contacted his PCP's office and they have recommended that we send him to the ED for further evaluation and management.    I did explain to the patient the reason why we could not pursue intracavernosal injections today due to the fact that his blood pressure was elevated.  If he should experience a priapism during his titrations, the medication we need to inject (phenylephrine) has the side effect of increasing blood pressure.  As his blood pressure is already at a dangerous level, we could not safely inject phenylephrine to address a possible priapism.    A total of 20 minutes was spent in patient education, counseling, and coordination of care regarding his ED, HTN and coordinating care with PCP.

## 2020-01-12 NOTE — ED Triage Notes (Signed)
Patient to ER for c/o HTN (from urologist office at [facility]). Patient states BP has been high recently, PCP had him increase his Lisinopril from 5mg  to 20mg  (took this morning for the first time at that dose). Denies any chest pain or headache.

## 2020-03-15 ENCOUNTER — Encounter: Payer: Self-pay | Admitting: Podiatry

## 2020-03-15 ENCOUNTER — Ambulatory Visit (INDEPENDENT_AMBULATORY_CARE_PROVIDER_SITE_OTHER): Payer: Medicare Other | Admitting: Podiatry

## 2020-03-15 ENCOUNTER — Other Ambulatory Visit: Payer: Self-pay

## 2020-03-15 DIAGNOSIS — E1142 Type 2 diabetes mellitus with diabetic polyneuropathy: Secondary | ICD-10-CM

## 2020-03-15 DIAGNOSIS — M2040 Other hammer toe(s) (acquired), unspecified foot: Secondary | ICD-10-CM | POA: Diagnosis not present

## 2020-03-15 DIAGNOSIS — Q828 Other specified congenital malformations of skin: Secondary | ICD-10-CM | POA: Diagnosis not present

## 2020-03-15 DIAGNOSIS — B351 Tinea unguium: Secondary | ICD-10-CM | POA: Diagnosis not present

## 2020-03-15 DIAGNOSIS — M79675 Pain in left toe(s): Secondary | ICD-10-CM | POA: Diagnosis not present

## 2020-03-15 DIAGNOSIS — M79674 Pain in right toe(s): Secondary | ICD-10-CM | POA: Diagnosis not present

## 2020-03-16 ENCOUNTER — Other Ambulatory Visit (INDEPENDENT_AMBULATORY_CARE_PROVIDER_SITE_OTHER): Payer: Self-pay | Admitting: Physician Assistant

## 2020-03-16 ENCOUNTER — Other Ambulatory Visit (INDEPENDENT_AMBULATORY_CARE_PROVIDER_SITE_OTHER): Payer: Self-pay | Admitting: Vascular Surgery

## 2020-03-16 DIAGNOSIS — M79606 Pain in leg, unspecified: Secondary | ICD-10-CM

## 2020-03-16 DIAGNOSIS — I739 Peripheral vascular disease, unspecified: Secondary | ICD-10-CM

## 2020-03-18 ENCOUNTER — Encounter: Payer: Self-pay | Admitting: Podiatry

## 2020-03-18 NOTE — Progress Notes (Signed)
  Subjective:  Patient ID: Marcus [name], male    DOB: [birth year],  MRN: [id number]  Chief Complaint  Patient presents with  . Callouses    i have some calluses that need to be trimmed up on both feet    59 y.o. male returns for the above complaint.  Patient presents with thickened elongated dystrophic toenails x10.  Patient states that they are been painful and would like to have them debrided down.  Patient states that he has not been able to do it himself.  It is painful when ambulating.  Patient also has secondary complaint of some 1 assessment 5 calluses bilaterally.  Patient states that they have been painful to walk on as well.  He would like to have them debrided out as well.  Patient is diabetic with last A1c of unknown.  Patient was also interested in getting diabetic shoes as well.  Objective:  There were no vitals filed for this visit. Podiatric Exam: Vascular: dorsalis pedis and posterior tibial pulses are palpable bilateral. Capillary return is immediate. Temperature gradient is WNL. Skin turgor WNL  Sensorium: Normal Semmes Weinstein monofilament test. Normal tactile sensation bilaterally. Nail Exam: Pt has thick disfigured discolored nails with subungual debris noted bilateral entire nail hallux through fifth toenails.  Pain on palpation to the nails. Ulcer Exam: There is no evidence of ulcer or pre-ulcerative changes or infection. Orthopedic Exam: Muscle tone and strength are WNL. No limitations in general ROM. No crepitus or effusions noted. HAV  B/L.  Hammer toes 2-5  B/L. Skin: Porokeratosis bilateral submet 1 and bilateral submet 5.  Central nucleated core noted.  No pinpoint bleeding noted.. No infection or ulcers    Assessment & Plan:   1. Diabetic polyneuropathy associated with type 2 diabetes mellitus (HCC)   2. Porokeratosis   3. Pain due to onychomycosis of toenails of both feet   4. Acquired hammertoe     Patient was evaluated and treated and all questions  answered.  Porokeratosis bilateral submet 1 and submet 5 -I explained to the patient the etiology of porokeratosis and various treatment options were extensively discussed.  I believe patient will benefit from aggressive debridement using a chisel blade and a handle that tissue was debrided down to healthy striated tissue and the excision of nucleated core was performed.  No complication noted.  No pinpoint bleeding noted.  Hammertoe contractures 2 through 5 bilateral -I explained patient the etiology of hammertoe contractures and she various treatment options were discussed.  Given that patient is diabetic with contracture of the toes and callus formation I believe he will benefit from diabetic shoes to help distribute the pressure evenly and prevent ulcers. -He will be scheduled see Raiford Noble for diabetic shoes  Onychomycosis with pain  -Nails palliatively debrided as below. -Educated on self-care  Procedure: Nail Debridement Rationale: pain  Type of Debridement: manual, sharp debridement. Instrumentation: Nail nipper, rotary burr. Number of Nails: 10  Procedures and Treatment: Consent by patient was obtained for treatment procedures. The patient understood the discussion of treatment and procedures well. All questions were answered thoroughly reviewed. Debridement of mycotic and hypertrophic toenails, 1 through 5 bilateral and clearing of subungual debris. No ulceration, no infection noted.  Return Visit-Office Procedure: Patient instructed to return to the office for a follow up visit 3 months for continued evaluation and treatment.  Nicholes Rough, DPM    No follow-ups on file.

## 2020-03-19 ENCOUNTER — Ambulatory Visit (INDEPENDENT_AMBULATORY_CARE_PROVIDER_SITE_OTHER): Payer: Medicare Other

## 2020-03-19 ENCOUNTER — Ambulatory Visit (INDEPENDENT_AMBULATORY_CARE_PROVIDER_SITE_OTHER): Payer: Medicare Other | Admitting: Vascular Surgery

## 2020-03-19 ENCOUNTER — Encounter (INDEPENDENT_AMBULATORY_CARE_PROVIDER_SITE_OTHER): Payer: Self-pay | Admitting: Vascular Surgery

## 2020-03-19 ENCOUNTER — Other Ambulatory Visit: Payer: Self-pay

## 2020-03-19 VITALS — BP 129/76 | HR 82 | Resp 16 | Wt 204.0 lb

## 2020-03-19 DIAGNOSIS — I739 Peripheral vascular disease, unspecified: Secondary | ICD-10-CM | POA: Diagnosis not present

## 2020-03-19 DIAGNOSIS — I10 Essential (primary) hypertension: Secondary | ICD-10-CM

## 2020-03-19 DIAGNOSIS — Z794 Long term (current) use of insulin: Secondary | ICD-10-CM

## 2020-03-19 DIAGNOSIS — E782 Mixed hyperlipidemia: Secondary | ICD-10-CM

## 2020-03-19 DIAGNOSIS — M79606 Pain in leg, unspecified: Secondary | ICD-10-CM

## 2020-03-19 DIAGNOSIS — E1151 Type 2 diabetes mellitus with diabetic peripheral angiopathy without gangrene: Secondary | ICD-10-CM

## 2020-03-21 ENCOUNTER — Encounter (INDEPENDENT_AMBULATORY_CARE_PROVIDER_SITE_OTHER): Payer: Self-pay | Admitting: Vascular Surgery

## 2020-03-21 NOTE — Progress Notes (Signed)
MRN : [id number]  Marcus [name] is a 59 y.o. (09-Apr-1961) male who presents with chief complaint of  Chief Complaint  Patient presents with  . Follow-up    ultrasound follow up  .  History of Present Illness:   The patient returns to the office for follow up evaluation of painful lower extremities and diminished pulses. Patient notes the pain is always associated with activity and is very consistent day today. Typically, the pain occurs at less than one block, progress is as activity continues to the point that the patient must stop walking. Resting including standing still for several minutes allowed resumption of the activity and the ability to walk a similar distance before stopping again. Uneven terrain and inclined shorten the distance. The pain has been progressive over the past several years. The patient states his difficulty walking is not having a profound negative impact on quality of life and his daily activities at this time.  The patient denies rest pain or dangling of an extremity off the side of the bed during the night for relief. No open wounds or sores at this time. No prior interventions or surgeries.  No history of back problems or DJD of the lumbar sacral spine.   The patient denies changes in claudication symptoms or new rest pain symptoms.  No new ulcers or wounds of the foot.  The patient's blood pressure has been stable and relatively well controlled. The patient denies amaurosis fugax or recent TIA symptoms. There are no recent neurological changes noted. The patient denies history of DVT, PE or superficial thrombophlebitis. The patient denies recent episodes of angina or shortness of breath.   ABI Rt=0.95 and Lt=1.02; however the toe index is only 0.40 compared to 0.70 previously; Doppler signals are biphasic bilaterally (Rt=1.00 and Lt=0.81 Doppler signals are biphasic bilaterally)  Current Meds  Medication Sig  . ACCU-CHEK AVIVA PLUS test strip  USE STRIP TO CHECK GLUCOSE THREE TIMES DAILY  . ACCU-CHEK FASTCLIX LANCETS MISC USE TO CHECK BLOOD SUGAR THREE TIMES DAILY  . allopurinol (ZYLOPRIM) 100 MG tablet TAKE 1 TABLET BY MOUTH ONCE DAILY TO PREVENT GOUT  . aspirin EC 81 MG tablet Take by mouth.  Marland Kitchen atorvastatin (LIPITOR) 40 MG tablet Take 40 mg by mouth daily at 6 PM.   . diclofenac (VOLTAREN) 75 MG EC tablet TAKE 1 TABLET BY MOUTH TWICE DAILY WITH FOOD AS NEEDED FOR PAIN  . furosemide (LASIX) 20 MG tablet TAKE 1 TABLET BY MOUTH IN THE MORNING FOR HIGH BLOOD PRESSURE. TAKE IN PLACE OF HCTZ.  . gabapentin (NEURONTIN) 300 MG capsule Take 300 mg by mouth as needed.   Marland Kitchen LANTUS SOLOSTAR 100 UNIT/ML Solostar Pen Inject into the skin. 31 units  . latanoprost (XALATAN) 0.005 % ophthalmic solution   . lisinopril (PRINIVIL,ZESTRIL) 5 MG tablet 20 mg.   . lisinopril (ZESTRIL) 20 MG tablet Take 20 mg by mouth daily.  Marland Kitchen RELION PEN NEEDLES 31G X 6 MM MISC   . sildenafil (REVATIO) 20 MG tablet Take 3 to 5 tablets two hours before intercouse on an empty stomach.  Do not take with nitrates.  . TRADJENTA 5 MG TABS tablet TAKE 1 TABLET BY MOUTH ONCE DAILY FOR DIABETES  . TRESIBA FLEXTOUCH 100 UNIT/ML SOPN FlexTouch Pen   . triamcinolone cream (KENALOG) 0.1 %     Past Medical History:  Diagnosis Date  . Anxiety   . Diabetes mellitus without complication ([place])   . ED (erectile dysfunction)   .  Hyperlipidemia   . Hypertension     Past Surgical History:  Procedure Laterality Date  . NO PAST SURGERIES      Social History Social History   Tobacco Use  . Smoking status: Former Games developer  . Smokeless tobacco: Never Used  Substance Use Topics  . Alcohol use: Yes  . Drug use: No    Family History Family History  Problem Relation Age of Onset  . Cancer Father        Lung  . Cancer Mother        spinal  . Prostate cancer Neg Hx   . Bladder Cancer Neg Hx   . Kidney cancer Neg Hx     No Known Allergies   REVIEW OF SYSTEMS (Negative  unless checked)  Constitutional: [] Weight loss  [] Fever  [] Chills Cardiac: [] Chest pain   [] Chest pressure   [] Palpitations   [] Shortness of breath when laying flat   [] Shortness of breath with exertion. Vascular:  [x] Pain in legs with walking   [] Pain in legs at rest  [] History of DVT   [] Phlebitis   [] Swelling in legs   [] Varicose veins   [] Non-healing ulcers Pulmonary:   [] Uses home oxygen   [] Productive cough   [] Hemoptysis   [] Wheeze  [] COPD   [] Asthma Neurologic:  [] Dizziness   [] Seizures   [] History of stroke   [] History of TIA  [] Aphasia   [] Vissual changes   [] Weakness or numbness in arm   [] Weakness or numbness in leg Musculoskeletal:   [] Joint swelling   [] Joint pain   [] Low back pain Hematologic:  [] Easy bruising  [] Easy bleeding   [] Hypercoagulable state   [] Anemic Gastrointestinal:  [] Diarrhea   [] Vomiting  [] Gastroesophageal reflux/heartburn   [] Difficulty swallowing. Genitourinary:  [] Chronic kidney disease   [] Difficult urination  [] Frequent urination   [] Blood in urine Skin:  [] Rashes   [] Ulcers  Psychological:  [] History of anxiety   []  History of major depression.  Physical Examination  Vitals:   03/19/20 1508  BP: 129/76  Pulse: 82  Resp: 16  Weight: 204 lb (92.5 kg)   Body mass index is 29.69 kg/m. Gen: WD/WN, NAD Head: [place]/AT, No temporalis wasting.  Ear/Nose/Throat: Hearing grossly intact, nares w/o erythema or drainage Eyes: PER, EOMI, sclera nonicteric.  Neck: Supple, no large masses.   Pulmonary:  Good air movement, no audible wheezing bilaterally, no use of accessory muscles.  Cardiac: RRR, no JVD Vascular:  Vessel Right Left  Radial Palpable Palpable  PT Not Palpable Not Palpable  DP Not Palpable Not Palpable  Gastrointestinal: Non-distended. No guarding/no peritoneal signs.  Musculoskeletal: M/S 5/5 throughout.  No deformity or atrophy.  Neurologic: CN 2-12 intact. Symmetrical.  Speech is fluent. Motor exam as listed above. Psychiatric: Judgment  intact, Mood & affect appropriate for pt's clinical situation. Dermatologic: No rashes or ulcers noted.  No changes consistent with cellulitis.   CBC Lab Results  Component Value Date   WBC 5.8 01/12/2020   HGB 17.5 (H) 01/12/2020   HCT 51.8 01/12/2020   MCV 89.3 01/12/2020   PLT 248 01/12/2020    BMET    Component Value Date/Time   NA 136 01/12/2020 0935   NA 132 (L) 05/11/2012 1137   K 3.9 01/12/2020 0935   K 3.7 05/11/2012 1137   CL 102 01/12/2020 0935   CL 98 05/11/2012 1137   CO2 23 01/12/2020 0935   CO2 26 05/11/2012 1137   GLUCOSE 171 (H) 01/12/2020 0935   GLUCOSE 361 (H) 05/11/2012  1137   BUN 29 (H) 01/12/2020 0935   BUN 25 (H) 05/11/2012 1137   CREATININE 1.42 (H) 01/12/2020 0935   CREATININE 1.36 (H) 05/11/2012 1137   CALCIUM 9.5 01/12/2020 0935   CALCIUM 9.7 05/11/2012 1137   GFRNONAA 53 (L) 01/12/2020 0935   GFRNONAA 59 (L) 05/11/2012 1137   GFRAA >60 01/12/2020 0935   GFRAA >60 05/11/2012 1137   CrCl cannot be calculated (Patient's most recent lab result is older than the maximum 21 days allowed.).  COAG No results found for: INR, PROTIME  Radiology VAS [place] ABI WITH/WO TBI  Result Date: 03/19/2020 LOWER EXTREMITY DOPPLER STUDY Indications: Peripheral artery disease.  Comparison Study: 09/2018 Performing Technologist: Salvadore Farber RVT  Examination Guidelines: A complete evaluation includes at minimum, Doppler waveform signals and systolic blood pressure reading at the level of bilateral brachial, anterior tibial, and posterior tibial arteries, when vessel segments are accessible. Bilateral testing is considered an integral part of a complete examination. Photoelectric Plethysmograph (PPG) waveforms and toe systolic pressure readings are included as required and additional duplex testing as needed. Limited examinations for reoccurring indications may be performed as noted.  ABI Findings: +---------+------------------+-----+--------+--------+ Right    Rt  Pressure (mmHg)IndexWaveformComment  +---------+------------------+-----+--------+--------+ Brachial 133                                     +---------+------------------+-----+--------+--------+ ATA      127               0.95 biphasic         +---------+------------------+-----+--------+--------+ PTA      115               0.86 biphasic         +---------+------------------+-----+--------+--------+ Great Toe87                0.65 Normal           +---------+------------------+-----+--------+--------+ +---------+------------------+-----+--------+-------+ Left     Lt Pressure (mmHg)IndexWaveformComment +---------+------------------+-----+--------+-------+ Popliteal                               NonComp +---------+------------------+-----+--------+-------+ ATA                             biphasic        +---------+------------------+-----+--------+-------+ PTA      136               1.02 biphasic        +---------+------------------+-----+--------+-------+ Great Toe63                0.47 Abnormal        +---------+------------------+-----+--------+-------+ +-------+-----------+-----------+------------+------------+ ABI/TBIToday's ABIToday's TBIPrevious ABIPrevious TBI +-------+-----------+-----------+------------+------------+ Right  .95        .65        1.00        .63          +-------+-----------+-----------+------------+------------+ Left   1.02       .40        .63         .71          +-------+-----------+-----------+------------+------------+ Bilateral ABIs appear essentially unchanged compared to prior study on 09/2018. Left TBIs appear decreased.  Summary: Left: Resting left ankle-brachial index is within normal range. No evidence of significant left lower extremity arterial disease. The  left toe-brachial index is abnormal.  *See table(s) above for measurements and observations.  Electronically signed by Levora Dredge MD on  03/19/2020 at 4:45:33 PM.   Final      Assessment/Plan 1. PAD (peripheral artery disease) (HCC)  Recommend:  The patient has evidence of atherosclerosis of the lower extremities with claudication.  The patient does not voice lifestyle limiting changes at this point in time.  Noninvasive studies do not suggest clinically significant change.  No invasive studies, angiography or surgery at this time The patient should continue walking and begin a more formal exercise program.  The patient should continue antiplatelet therapy and aggressive treatment of the lipid abnormalities  No changes in the patient's medications at this time  The patient should continue wearing graduated compression socks 10-15 mmHg strength to control the mild edema.   - VAS [place] ABI WITH/WO TBI; Future  2. Essential hypertension Continue antihypertensive medications as already ordered, these medications have been reviewed and there are no changes at this time.   3. Type 2 diabetes mellitus with diabetic peripheral angiopathy without gangrene, with long-term current use of insulin (HCC) Continue hypoglycemic medications as already ordered, these medications have been reviewed and there are no changes at this time.  Hgb A1C to be monitored as already arranged by primary service   4. Mixed hyperlipidemia Continue statin as ordered and reviewed, no changes at this time    Levora Dredge, MD  03/21/2020 12:52 PM

## 2020-03-22 ENCOUNTER — Encounter: Payer: Self-pay | Admitting: Podiatry

## 2020-03-28 ENCOUNTER — Ambulatory Visit: Payer: Medicare Other | Admitting: Orthotics

## 2020-03-28 ENCOUNTER — Other Ambulatory Visit: Payer: Self-pay

## 2020-03-28 DIAGNOSIS — E1142 Type 2 diabetes mellitus with diabetic polyneuropathy: Secondary | ICD-10-CM

## 2020-03-28 DIAGNOSIS — M205X9 Other deformities of toe(s) (acquired), unspecified foot: Secondary | ICD-10-CM

## 2020-03-28 DIAGNOSIS — B351 Tinea unguium: Secondary | ICD-10-CM

## 2020-03-28 DIAGNOSIS — Q828 Other specified congenital malformations of skin: Secondary | ICD-10-CM

## 2020-03-28 DIAGNOSIS — M2040 Other hammer toe(s) (acquired), unspecified foot: Secondary | ICD-10-CM

## 2020-03-28 NOTE — Progress Notes (Signed)

## 2020-03-29 ENCOUNTER — Encounter: Payer: Self-pay | Admitting: Podiatry

## 2020-03-30 ENCOUNTER — Encounter: Payer: Self-pay | Admitting: Podiatry

## 2020-04-08 ENCOUNTER — Encounter: Payer: Self-pay | Admitting: Podiatry

## 2020-05-09 ENCOUNTER — Encounter: Payer: Self-pay | Admitting: Podiatry

## 2020-05-30 ENCOUNTER — Encounter: Payer: Self-pay | Admitting: Podiatry

## 2020-05-31 NOTE — Telephone Encounter (Signed)
I sent him a message thru my chart that they are on back order estimated to ship mid sept

## 2020-06-27 ENCOUNTER — Encounter: Payer: Self-pay | Admitting: Podiatry

## 2020-07-26 ENCOUNTER — Encounter (INDEPENDENT_AMBULATORY_CARE_PROVIDER_SITE_OTHER): Payer: Self-pay

## 2020-08-01 ENCOUNTER — Ambulatory Visit: Payer: Medicare Other | Admitting: Urology

## 2020-08-02 ENCOUNTER — Ambulatory Visit: Payer: Medicare Other | Admitting: Urology

## 2020-09-17 ENCOUNTER — Ambulatory Visit (INDEPENDENT_AMBULATORY_CARE_PROVIDER_SITE_OTHER): Payer: Medicare Other | Admitting: Vascular Surgery

## 2020-09-17 ENCOUNTER — Ambulatory Visit (INDEPENDENT_AMBULATORY_CARE_PROVIDER_SITE_OTHER): Payer: Medicare Other

## 2020-09-17 ENCOUNTER — Other Ambulatory Visit: Payer: Self-pay

## 2020-09-17 ENCOUNTER — Encounter: Payer: Self-pay | Admitting: Podiatry

## 2020-09-17 DIAGNOSIS — I739 Peripheral vascular disease, unspecified: Secondary | ICD-10-CM

## 2020-09-19 ENCOUNTER — Encounter (INDEPENDENT_AMBULATORY_CARE_PROVIDER_SITE_OTHER): Payer: Self-pay | Admitting: *Deleted

## 2020-09-19 ENCOUNTER — Other Ambulatory Visit (INDEPENDENT_AMBULATORY_CARE_PROVIDER_SITE_OTHER): Payer: Self-pay | Admitting: Vascular Surgery

## 2020-09-19 DIAGNOSIS — I739 Peripheral vascular disease, unspecified: Secondary | ICD-10-CM

## 2020-09-27 ENCOUNTER — Encounter: Payer: Self-pay | Admitting: Podiatry

## 2020-09-27 ENCOUNTER — Ambulatory Visit (INDEPENDENT_AMBULATORY_CARE_PROVIDER_SITE_OTHER): Payer: Medicare Other | Admitting: Podiatry

## 2020-09-27 ENCOUNTER — Other Ambulatory Visit: Payer: Self-pay

## 2020-09-27 DIAGNOSIS — M2042 Other hammer toe(s) (acquired), left foot: Secondary | ICD-10-CM

## 2020-09-27 DIAGNOSIS — M2022 Hallux rigidus, left foot: Secondary | ICD-10-CM

## 2020-09-27 DIAGNOSIS — M2041 Other hammer toe(s) (acquired), right foot: Secondary | ICD-10-CM

## 2020-09-27 DIAGNOSIS — M2021 Hallux rigidus, right foot: Secondary | ICD-10-CM

## 2020-09-27 DIAGNOSIS — M205X9 Other deformities of toe(s) (acquired), unspecified foot: Secondary | ICD-10-CM

## 2020-09-27 DIAGNOSIS — E1142 Type 2 diabetes mellitus with diabetic polyneuropathy: Secondary | ICD-10-CM

## 2020-09-27 DIAGNOSIS — M2040 Other hammer toe(s) (acquired), unspecified foot: Secondary | ICD-10-CM

## 2020-09-27 DIAGNOSIS — E1140 Type 2 diabetes mellitus with diabetic neuropathy, unspecified: Secondary | ICD-10-CM

## 2020-09-27 NOTE — Progress Notes (Signed)
Patient presents to pick up diabetic shoes from Dr.  Allena Katz.  Marcus [name] DPM

## 2020-10-13 ENCOUNTER — Encounter: Payer: Self-pay | Admitting: Podiatry

## 2020-10-30 ENCOUNTER — Ambulatory Visit (INDEPENDENT_AMBULATORY_CARE_PROVIDER_SITE_OTHER): Payer: Medicare Other | Admitting: Podiatry

## 2020-10-30 ENCOUNTER — Other Ambulatory Visit: Payer: Self-pay

## 2020-10-30 ENCOUNTER — Encounter: Payer: Self-pay | Admitting: Podiatry

## 2020-10-30 DIAGNOSIS — M79675 Pain in left toe(s): Secondary | ICD-10-CM

## 2020-10-30 DIAGNOSIS — B351 Tinea unguium: Secondary | ICD-10-CM | POA: Diagnosis not present

## 2020-10-30 DIAGNOSIS — Q828 Other specified congenital malformations of skin: Secondary | ICD-10-CM

## 2020-10-30 DIAGNOSIS — E1142 Type 2 diabetes mellitus with diabetic polyneuropathy: Secondary | ICD-10-CM | POA: Diagnosis not present

## 2020-10-30 DIAGNOSIS — M79674 Pain in right toe(s): Secondary | ICD-10-CM

## 2020-10-30 NOTE — Progress Notes (Signed)
  Subjective:  Patient ID: Marcus [name], male    DOB: 11/23/1960,  MRN: [id number]  Chief Complaint  Patient presents with  . Callouses    Patient presents today for painful callouses bilat feet.  He says they burn, sharp stabbing pains, "feels like Im stepping on a nail"  He says the right foot is much worse than left   60 y.o. male returns for the above complaint.  Patient presents with thickened elongated dystrophic toenails x10.  Patient states that they are been painful and would like to have them debrided down.  Patient states that he has not been able to do it himself.  It is painful when ambulating.  Patient also has secondary complaint of some 1 assessment 5 calluses bilaterally.  Patient states that they have been painful to walk on as well.  He would like to have them debrided out as well.  Patient is diabetic with around 8%.  Objective:  There were no vitals filed for this visit. Podiatric Exam: Vascular: dorsalis pedis and posterior tibial pulses are palpable bilateral. Capillary return is immediate. Temperature gradient is WNL. Skin turgor WNL  Sensorium: Normal Semmes Weinstein monofilament test. Normal tactile sensation bilaterally. Nail Exam: Pt has thick disfigured discolored nails with subungual debris noted bilateral entire nail hallux through fifth toenails.  Pain on palpation to the nails. Ulcer Exam: There is no evidence of ulcer or pre-ulcerative changes or infection. Orthopedic Exam: Muscle tone and strength are WNL. No limitations in general ROM. No crepitus or effusions noted. HAV  B/L.  Hammer toes 2-5  B/L. Skin: Porokeratosis bilateral submet 1 and bilateral submet 5.  Central nucleated core noted.  No pinpoint bleeding noted.. No infection or ulcers    Assessment & Plan:   1. Diabetic polyneuropathy associated with type 2 diabetes mellitus (HCC)   2. Porokeratosis   3. Pain due to onychomycosis of toenails of both feet     Patient was evaluated and  treated and all questions answered.  Porokeratosis bilateral submet 1 and submet 5 -I explained to the patient the etiology of porokeratosis and various treatment options were extensively discussed.  I believe patient will benefit from aggressive debridement using a chisel blade and a handle that tissue was debrided down to healthy striated tissue and the excision of nucleated core was performed.  No complication noted.  No pinpoint bleeding noted.  Hammertoe contractures 2 through 5 bilateral -I explained patient the etiology of hammertoe contractures and she various treatment options were discussed.  Given that patient is diabetic with contracture of the toes and callus formation I believe he will benefit from diabetic shoes to help distribute the pressure evenly and prevent ulcers. -He has obtained diabetic shoes and is functioning well in them.  Onychomycosis with pain  -Nails palliatively debrided as below. -Educated on self-care  Procedure: Nail Debridement Rationale: pain  Type of Debridement: manual, sharp debridement. Instrumentation: Nail nipper, rotary burr. Number of Nails: 10  Procedures and Treatment: Consent by patient was obtained for treatment procedures. The patient understood the discussion of treatment and procedures well. All questions were answered thoroughly reviewed. Debridement of mycotic and hypertrophic toenails, 1 through 5 bilateral and clearing of subungual debris. No ulceration, no infection noted.  Return Visit-Office Procedure: Patient instructed to return to the office for a follow up visit 3 months for continued evaluation and treatment.  Nicholes Rough, DPM    No follow-ups on file.

## 2021-01-10 ENCOUNTER — Encounter: Payer: Self-pay | Admitting: Podiatry

## 2021-01-17 ENCOUNTER — Encounter (INDEPENDENT_AMBULATORY_CARE_PROVIDER_SITE_OTHER): Payer: Self-pay

## 2021-01-21 ENCOUNTER — Encounter (INDEPENDENT_AMBULATORY_CARE_PROVIDER_SITE_OTHER): Payer: Self-pay

## 2021-03-17 ENCOUNTER — Other Ambulatory Visit (INDEPENDENT_AMBULATORY_CARE_PROVIDER_SITE_OTHER): Payer: Self-pay | Admitting: Vascular Surgery

## 2021-03-17 DIAGNOSIS — I739 Peripheral vascular disease, unspecified: Secondary | ICD-10-CM

## 2021-03-18 ENCOUNTER — Ambulatory Visit (INDEPENDENT_AMBULATORY_CARE_PROVIDER_SITE_OTHER): Payer: Medicare Other | Admitting: Vascular Surgery

## 2021-03-18 ENCOUNTER — Encounter (INDEPENDENT_AMBULATORY_CARE_PROVIDER_SITE_OTHER): Payer: Medicare Other

## 2021-03-28 ENCOUNTER — Ambulatory Visit (INDEPENDENT_AMBULATORY_CARE_PROVIDER_SITE_OTHER): Payer: Medicare Other

## 2021-03-28 ENCOUNTER — Other Ambulatory Visit: Payer: Self-pay

## 2021-03-28 ENCOUNTER — Ambulatory Visit (INDEPENDENT_AMBULATORY_CARE_PROVIDER_SITE_OTHER): Payer: Medicare Other | Admitting: Nurse Practitioner

## 2021-03-28 DIAGNOSIS — I739 Peripheral vascular disease, unspecified: Secondary | ICD-10-CM | POA: Diagnosis not present

## 2021-04-16 ENCOUNTER — Encounter (INDEPENDENT_AMBULATORY_CARE_PROVIDER_SITE_OTHER): Payer: Self-pay | Admitting: *Deleted

## 2021-04-30 ENCOUNTER — Encounter: Payer: Self-pay | Admitting: Podiatry

## 2021-04-30 ENCOUNTER — Other Ambulatory Visit: Payer: Self-pay

## 2021-04-30 ENCOUNTER — Ambulatory Visit (INDEPENDENT_AMBULATORY_CARE_PROVIDER_SITE_OTHER): Payer: Medicare Other | Admitting: Podiatry

## 2021-04-30 DIAGNOSIS — M79674 Pain in right toe(s): Secondary | ICD-10-CM | POA: Diagnosis not present

## 2021-04-30 DIAGNOSIS — Q828 Other specified congenital malformations of skin: Secondary | ICD-10-CM

## 2021-04-30 DIAGNOSIS — E1142 Type 2 diabetes mellitus with diabetic polyneuropathy: Secondary | ICD-10-CM

## 2021-04-30 DIAGNOSIS — M79675 Pain in left toe(s): Secondary | ICD-10-CM

## 2021-04-30 DIAGNOSIS — B351 Tinea unguium: Secondary | ICD-10-CM

## 2021-04-30 NOTE — Progress Notes (Signed)
  Subjective:  Patient ID: Marcus [name], male    DOB: March 15, 1961,  MRN: [id number]  Chief Complaint  Patient presents with   Callouses    "I need to get these callouses together, get them trimmed down a little more.  I think it's about time to get some new shoes."   60 y.o. male returns for the above complaint.  Patient presents with thickened elongated dystrophic toenails x10.  Patient states that they are been painful and would like to have them debrided down.  Patient states that he has not been able to do it himself.  It is painful when ambulating.  Patient also has secondary complaint of some 1 assessment 5 calluses bilaterally.  Patient states that they have been painful to walk on as well.  He would like to have them debrided out as well.  Patient is diabetic with around 8%.  Objective:  There were no vitals filed for this visit. Podiatric Exam: Vascular: dorsalis pedis and posterior tibial pulses are palpable bilateral. Capillary return is immediate. Temperature gradient is WNL. Skin turgor WNL  Sensorium: Normal Semmes Weinstein monofilament test. Normal tactile sensation bilaterally. Nail Exam: Pt has thick disfigured discolored nails with subungual debris noted bilateral entire nail hallux through fifth toenails.  Pain on palpation to the nails. Ulcer Exam: There is no evidence of ulcer or pre-ulcerative changes or infection. Orthopedic Exam: Muscle tone and strength are WNL. No limitations in general ROM. No crepitus or effusions noted. HAV  B/L.  Hammer toes 2-5  B/L. Skin: Porokeratosis bilateral submet 1 and bilateral submet 5.  Central nucleated core noted.  No pinpoint bleeding noted.. No infection or ulcers    Assessment & Plan:   1. Diabetic polyneuropathy associated with type 2 diabetes mellitus (HCC)   2. Porokeratosis   3. Pain due to onychomycosis of toenails of both feet      Patient was evaluated and treated and all questions answered.  Porokeratosis  bilateral submet 1 and submet 5 -I explained to the patient the etiology of porokeratosis and various treatment options were extensively discussed.  I believe patient will benefit from aggressive debridement using a chisel blade and a handle that tissue was debrided down to healthy striated tissue and the excision of nucleated core was performed.  No complication noted.  No pinpoint bleeding noted.  Hammertoe contractures 2 through 5 bilateral -I explained patient the etiology of hammertoe contractures and she various treatment options were discussed.  Given that patient is diabetic with contracture of the toes and callus formation I believe he will benefit from diabetic shoes to help distribute the pressure evenly and prevent ulcers. -He has obtained diabetic shoes and is functioning well in them.  Onychomycosis with pain  -Nails palliatively debrided as below. -Educated on self-care  Procedure: Nail Debridement Rationale: pain  Type of Debridement: manual, sharp debridement. Instrumentation: Nail nipper, rotary burr. Number of Nails: 10  Procedures and Treatment: Consent by patient was obtained for treatment procedures. The patient understood the discussion of treatment and procedures well. All questions were answered thoroughly reviewed. Debridement of mycotic and hypertrophic toenails, 1 through 5 bilateral and clearing of subungual debris. No ulceration, no infection noted.  Return Visit-Office Procedure: Patient instructed to return to the office for a follow up visit 3 months for continued evaluation and treatment.  Nicholes Rough, DPM    No follow-ups on file.

## 2021-09-03 ENCOUNTER — Ambulatory Visit: Payer: Medicare Other | Admitting: Podiatry

## 2021-10-14 ENCOUNTER — Other Ambulatory Visit: Payer: Medicare Other

## 2021-10-24 ENCOUNTER — Ambulatory Visit (INDEPENDENT_AMBULATORY_CARE_PROVIDER_SITE_OTHER): Payer: Medicare Other | Admitting: Vascular Surgery

## 2021-10-24 ENCOUNTER — Encounter (INDEPENDENT_AMBULATORY_CARE_PROVIDER_SITE_OTHER): Payer: Medicare Other

## 2021-10-28 ENCOUNTER — Other Ambulatory Visit: Payer: Self-pay

## 2021-10-28 ENCOUNTER — Ambulatory Visit: Payer: Medicare Other

## 2021-10-28 DIAGNOSIS — E1142 Type 2 diabetes mellitus with diabetic polyneuropathy: Secondary | ICD-10-CM

## 2021-10-28 DIAGNOSIS — M2040 Other hammer toe(s) (acquired), unspecified foot: Secondary | ICD-10-CM

## 2021-10-28 NOTE — Progress Notes (Signed)
SITUATION Reason for Consult: Evaluation for Prefabricated Diabetic Shoes and Bilateral Custom Diabetic Inserts. Patient / Caregiver Report: Patient would like well fitting shoes  OBJECTIVE DATA: Patient History / Diagnosis:    ICD-10-CM   1. Diabetic polyneuropathy associated with type 2 diabetes mellitus (HCC)  E11.42     2. Acquired hammertoe  M20.40       Current or Previous Devices:   Historical diabetic shoe user  In-Person Foot Examination: Ulcers & Callousing:   None  Toe / Foot Deformities:   - Hammertoes   Shoe Size: 66M  ORTHOTIC RECOMMENDATION Recommended Devices: - 1x pair prefabricated PDAC approved diabetic shoes: Patient selected - Apex X801M 66M - 3x pair custom-to-patient vacuum formed diabetic insoles.   GOALS OF SHOES AND INSOLES - Reduce shear and pressure - Reduce / Prevent callus formation - Reduce / Prevent ulceration - Protect the fragile healing compromised diabetic foot.  Patient would benefit from diabetic shoes and inserts as patient has diabetes mellitus and the patient has one or more of the following conditions: - Peripheral neuropathy with evidence of callus formation - Foot deformity - Poor circulation  ACTIONS PERFORMED Patient was casted for insoles via crush box and measured for shoes via brannock device. Procedure was explained and patient tolerated procedure well. All questions were answered and concerns addressed.  PLAN Patient is to ensure treating physician receives and completes diabetic paperwork. Casts and shoe order are to be held until paperwork is received. Once received patient is to be scheduled for fitting in four weeks.

## 2021-11-18 ENCOUNTER — Ambulatory Visit (INDEPENDENT_AMBULATORY_CARE_PROVIDER_SITE_OTHER): Payer: Medicare Other | Admitting: Vascular Surgery

## 2021-11-18 ENCOUNTER — Encounter (INDEPENDENT_AMBULATORY_CARE_PROVIDER_SITE_OTHER): Payer: Medicare Other

## 2021-11-21 ENCOUNTER — Ambulatory Visit (INDEPENDENT_AMBULATORY_CARE_PROVIDER_SITE_OTHER): Payer: Medicare Other | Admitting: Vascular Surgery

## 2021-11-21 ENCOUNTER — Encounter (INDEPENDENT_AMBULATORY_CARE_PROVIDER_SITE_OTHER): Payer: Medicare Other

## 2021-12-05 ENCOUNTER — Telehealth: Payer: Self-pay

## 2021-12-05 NOTE — Telephone Encounter (Signed)
Casts Sent to [employer] - HOLD for CMN °

## 2021-12-25 ENCOUNTER — Other Ambulatory Visit (INDEPENDENT_AMBULATORY_CARE_PROVIDER_SITE_OTHER): Payer: Self-pay | Admitting: Vascular Surgery

## 2021-12-25 DIAGNOSIS — I739 Peripheral vascular disease, unspecified: Secondary | ICD-10-CM

## 2021-12-26 ENCOUNTER — Encounter (INDEPENDENT_AMBULATORY_CARE_PROVIDER_SITE_OTHER): Payer: Medicare Other

## 2021-12-26 ENCOUNTER — Ambulatory Visit (INDEPENDENT_AMBULATORY_CARE_PROVIDER_SITE_OTHER): Payer: Medicare Other | Admitting: Vascular Surgery

## 2022-01-16 ENCOUNTER — Encounter (INDEPENDENT_AMBULATORY_CARE_PROVIDER_SITE_OTHER): Payer: Self-pay | Admitting: Vascular Surgery

## 2022-01-16 ENCOUNTER — Ambulatory Visit (INDEPENDENT_AMBULATORY_CARE_PROVIDER_SITE_OTHER): Payer: Medicare Other

## 2022-01-16 ENCOUNTER — Ambulatory Visit (INDEPENDENT_AMBULATORY_CARE_PROVIDER_SITE_OTHER): Payer: Medicare Other | Admitting: Vascular Surgery

## 2022-01-16 VITALS — BP 126/75 | HR 85 | Resp 18 | Ht 70.0 in | Wt 202.4 lb

## 2022-01-16 DIAGNOSIS — I739 Peripheral vascular disease, unspecified: Secondary | ICD-10-CM

## 2022-01-16 DIAGNOSIS — E782 Mixed hyperlipidemia: Secondary | ICD-10-CM | POA: Diagnosis not present

## 2022-01-16 DIAGNOSIS — I10 Essential (primary) hypertension: Secondary | ICD-10-CM | POA: Diagnosis not present

## 2022-01-16 DIAGNOSIS — E1151 Type 2 diabetes mellitus with diabetic peripheral angiopathy without gangrene: Secondary | ICD-10-CM

## 2022-01-16 DIAGNOSIS — Z794 Long term (current) use of insulin: Secondary | ICD-10-CM

## 2022-01-16 NOTE — Progress Notes (Signed)
? ? ? ? ?MRN : [id number] ? ?Marcus [name] is a 61 y.o. (1961-01-13) male who presents with chief complaint of check circulation. ? ?History of Present Illness:  ?The patient returns to the office for follow up evaluation of painful lower extremities and diminished pulses. Patient notes the pain is always associated with activity and is very consistent day today. Typically, the pain occurs at less than one block, progress is as activity continues to the point that the patient must stop walking. Resting including standing still for several minutes allowed resumption of the activity and the ability to walk a similar distance before stopping again. Uneven terrain and inclined shorten the distance. The pain has been progressive over the past several years. The patient states his difficulty walking is not having a profound negative impact on quality of life and his daily activities at this time. ?  ?The patient denies rest pain or dangling of an extremity off the side of the bed during the night for relief. ?No open wounds or sores at this time. ?No prior interventions or surgeries. ?  ?No history of back problems or DJD of the lumbar sacral spine.  ?  ?The patient denies changes in claudication symptoms or new rest pain symptoms.  No new ulcers or wounds of the foot. ?  ?The patient's blood pressure has been stable and relatively well controlled. ?The patient denies amaurosis fugax or recent TIA symptoms. There are no recent neurological changes noted. ?The patient denies history of DVT, PE or superficial thrombophlebitis. ?The patient denies recent episodes of angina or shortness of breath.  ?  ?ABI Rt=0.82 and Lt=[place] the Lt toe index is only 0.57; Doppler signals are biphasic bilaterally (Previous ABI Rt=1.05 and Lt=1.04 LT TBI 0.66; Doppler signals are biphasic bilaterally) ? ? ? ?No outpatient medications have been marked as taking for the 01/16/22 encounter (Appointment) with Gilda CreaseSchnier, Latina CraverGregory G, MD.  ? ? ?Past  Medical History:  ?Diagnosis Date  ? Anxiety   ? Diabetes mellitus without complication (HCC)   ? ED (erectile dysfunction)   ? Hyperlipidemia   ? Hypertension   ? ? ?Past Surgical History:  ?Procedure Laterality Date  ? NO PAST SURGERIES    ? ? ?Social History ?Social History  ? ?Tobacco Use  ? Smoking status: Former  ? Smokeless tobacco: Never  ?Substance Use Topics  ? Alcohol use: Yes  ? Drug use: No  ? ? ?Family History ?Family History  ?Problem Relation Age of Onset  ? Cancer Father   ?     Lung  ? Cancer Mother   ?     spinal  ? Prostate cancer Neg Hx   ? Bladder Cancer Neg Hx   ? Kidney cancer Neg Hx   ? ? ?No Known Allergies ? ? ?REVIEW OF SYSTEMS (Negative unless checked) ? ?Constitutional: [] Weight loss  [] Fever  [] Chills ?Cardiac: [] Chest pain   [] Chest pressure   [] Palpitations   [] Shortness of breath when laying flat   [] Shortness of breath with exertion. ?Vascular:  [x] Pain in legs with walking   [] Pain in legs at rest  [] History of DVT   [] Phlebitis   [] Swelling in legs   [] Varicose veins   [] Non-healing ulcers ?Pulmonary:   [] Uses home oxygen   [] Productive cough   [] Hemoptysis   [] Wheeze  [] COPD   [] Asthma ?Neurologic:  [] Dizziness   [] Seizures   [] History of stroke   [] History of TIA  [] Aphasia   [] Vissual changes   [] Weakness  or numbness in arm   [] Weakness or numbness in leg ?Musculoskeletal:   [] Joint swelling   [] Joint pain   [] Low back pain ?Hematologic:  [] Easy bruising  [] Easy bleeding   [] Hypercoagulable state   [] Anemic ?Gastrointestinal:  [] Diarrhea   [] Vomiting  [] Gastroesophageal reflux/heartburn   [] Difficulty swallowing. ?Genitourinary:  [] Chronic kidney disease   [] Difficult urination  [] Frequent urination   [] Blood in urine ?Skin:  [] Rashes   [] Ulcers  ?Psychological:  [] History of anxiety   []  History of major depression. ? ?Physical Examination ? ?There were no vitals filed for this visit. ?There is no height or weight on file to calculate BMI. ?Gen: WD/WN, NAD ?Head: [place]/AT, No  temporalis wasting.  ?Ear/Nose/Throat: Hearing grossly intact, nares w/o erythema or drainage ?Eyes: PER, EOMI, sclera nonicteric.  ?Neck: Supple, no masses.  No bruit or JVD.  ?Pulmonary:  Good air movement, no audible wheezing, no use of accessory muscles.  ?Cardiac: RRR, normal S1, S2, no Murmurs. ?Vascular:  mild trophic changes, no open wounds ?Vessel Right Left  ?Radial Palpable Palpable  ?PT Not Palpable Not Palpable  ?DP Not Palpable Not Palpable  ?Gastrointestinal: soft, non-distended. No guarding/no peritoneal signs.  ?Musculoskeletal: M/S 5/5 throughout.  No visible deformity.  ?Neurologic: CN 2-12 intact. Pain and light touch intact in extremities.  Symmetrical.  Speech is fluent. Motor exam as listed above. ?Psychiatric: Judgment intact, Mood & affect appropriate for pt's clinical situation. ?Dermatologic: No rashes or ulcers noted.  No changes consistent with cellulitis. ? ? ?CBC ?Lab Results  ?Component Value Date  ? WBC 5.8 01/12/2020  ? HGB 17.5 (H) 01/12/2020  ? HCT 51.8 01/12/2020  ? MCV 89.3 01/12/2020  ? PLT 248 01/12/2020  ? ? ?BMET ?   ?Component Value Date/Time  ? NA 136 01/12/2020 0935  ? NA 132 (L) 05/11/2012 1137  ? K 3.9 01/12/2020 0935  ? K 3.7 05/11/2012 1137  ? CL 102 01/12/2020 0935  ? CL 98 05/11/2012 1137  ? CO2 23 01/12/2020 0935  ? CO2 26 05/11/2012 1137  ? GLUCOSE 171 (H) 01/12/2020 0935  ? GLUCOSE 361 (H) 05/11/2012 1137  ? BUN 29 (H) 01/12/2020 0935  ? BUN 25 (H) 05/11/2012 1137  ? CREATININE 1.42 (H) 01/12/2020 0935  ? CREATININE 1.36 (H) 05/11/2012 1137  ? CALCIUM 9.5 01/12/2020 0935  ? CALCIUM 9.7 05/11/2012 1137  ? GFRNONAA 53 (L) 01/12/2020 0935  ? GFRNONAA 59 (L) 05/11/2012 1137  ? GFRAA >60 01/12/2020 0935  ? GFRAA >60 05/11/2012 1137  ? ?CrCl cannot be calculated (Patient's most recent lab result is older than the maximum 21 days allowed.). ? ?COAG ?No results found for: INR, PROTIME ? ?Radiology ?No results found. ? ? ?Assessment/Plan ?1. PAD (peripheral artery  disease) (HCC) ? Recommend: ? ?The patient has evidence of atherosclerosis of the lower extremities with claudication.  The patient does not voice lifestyle limiting changes at this point in time. ? ?Noninvasive studies do not suggest clinically significant change. ? ?No invasive studies, angiography or surgery at this time ?The patient should continue walking and begin a more formal exercise program.  ?The patient should continue antiplatelet therapy and aggressive treatment of the lipid abnormalities ? ?No changes in the patient's medications at this time ? ?Continued surveillance is indicated as atherosclerosis is likely to progress with time.   ? ?The patient will continue follow up with noninvasive studies as ordered.   ?- VAS 01/14/2020 ABI WITH/WO TBI; Future ? ?2. Essential hypertension ?Continue antihypertensive medications  as already ordered, these medications have been reviewed and there are no changes at this time.  ? ?3. Type 2 diabetes mellitus with diabetic peripheral angiopathy without gangrene, with long-term current use of insulin (HCC) ?Continue hypoglycemic medications as already ordered, these medications have been reviewed and there are no changes at this time. ? ?Hgb A1C to be monitored as already arranged by primary service  ? ?4. Mixed hyperlipidemia ?Continue statin as ordered and reviewed, no changes at this time  ? ? ? ?Levora Dredge, MD ? ?01/16/2022 ?12:19 PM ? ?  ?

## 2022-05-13 ENCOUNTER — Ambulatory Visit (INDEPENDENT_AMBULATORY_CARE_PROVIDER_SITE_OTHER): Payer: Medicare Other | Admitting: Podiatry

## 2022-05-13 DIAGNOSIS — M2042 Other hammer toe(s) (acquired), left foot: Secondary | ICD-10-CM | POA: Diagnosis not present

## 2022-05-13 NOTE — Progress Notes (Signed)
Subjective:  Patient ID: Marcus [name], male    DOB: 10-Nov-1960,  MRN: [id number]  Chief Complaint  Patient presents with   Nail Problem    Left foot 5th toe     61 y.o. male presents with the above complaint.  Patient presents with left submetatarsal 5 porokeratotic lesion painful to touch is progressive gotten worse.  Hurts with ambulation.  He would like to have it debrided down he has not seen MRIs prior to seeing me.  He has underlying hammertoe contracture which leads to a lot of pressure to the outside of the foot.  He denies seeing anyone else prior to seeing me.  He denies any other acute complaints hurts with pressure.   Review of Systems: Negative except as noted in the HPI. Denies N/V/F/Ch.  Past Medical History:  Diagnosis Date   Anxiety    Diabetes mellitus without complication (HCC)    ED (erectile dysfunction)    Hyperlipidemia    Hypertension     Current Outpatient Medications:    ACCU-CHEK AVIVA PLUS test strip, USE STRIP TO CHECK GLUCOSE THREE TIMES DAILY, Disp: , Rfl:    ACCU-CHEK FASTCLIX LANCETS MISC, USE TO CHECK BLOOD SUGAR THREE TIMES DAILY, Disp: , Rfl:    allopurinol (ZYLOPRIM) 100 MG tablet, TAKE 1 TABLET BY MOUTH ONCE DAILY TO PREVENT GOUT, Disp: , Rfl:    amLODipine (NORVASC) 5 MG tablet, Take 5 mg by mouth daily. for high blood pressure, Disp: , Rfl:    aspirin EC 81 MG tablet, Take by mouth., Disp: , Rfl:    atorvastatin (LIPITOR) 40 MG tablet, Take 40 mg by mouth daily at 6 PM. , Disp: , Rfl:    cyclobenzaprine (FLEXERIL) 5 MG tablet, Take 5 mg by mouth 2 (two) times daily. (Patient not taking: Reported on 01/16/2022), Disp: , Rfl:    diclofenac (VOLTAREN) 75 MG EC tablet, TAKE 1 TABLET BY MOUTH TWICE DAILY WITH FOOD AS NEEDED FOR PAIN (Patient not taking: Reported on 01/16/2022), Disp: , Rfl:    furosemide (LASIX) 20 MG tablet, TAKE 1 TABLET BY MOUTH IN THE MORNING FOR HIGH BLOOD PRESSURE. TAKE IN PLACE OF HCTZ., Disp: , Rfl:    gabapentin  (NEURONTIN) 300 MG capsule, Take 300 mg by mouth as needed. , Disp: , Rfl:    LANTUS SOLOSTAR 100 UNIT/ML Solostar Pen, Inject into the skin. 31 units (Patient not taking: Reported on 01/16/2022), Disp: , Rfl:    latanoprost (XALATAN) 0.005 % ophthalmic solution, , Disp: , Rfl:    lisinopril (ZESTRIL) 20 MG tablet, Take 20 mg by mouth daily., Disp: , Rfl:    RELION PEN NEEDLES 31G X 6 MM MISC, , Disp: , Rfl:    sildenafil (REVATIO) 20 MG tablet, Take 3 to 5 tablets two hours before intercouse on an empty stomach.  Do not take with nitrates., Disp: 30 tablet, Rfl: 5   TRADJENTA 5 MG TABS tablet, TAKE 1 TABLET BY MOUTH ONCE DAILY FOR DIABETES, Disp: , Rfl:    TRESIBA FLEXTOUCH 100 UNIT/ML SOPN FlexTouch Pen, , Disp: , Rfl:    triamcinolone cream (KENALOG) 0.1 %, , Disp: , Rfl:   Social History   Tobacco Use  Smoking Status Former  Smokeless Tobacco Never    No Known Allergies Objective:  There were no vitals filed for this visit. There is no height or weight on file to calculate BMI. Constitutional Well developed. Well nourished.  Vascular Dorsalis pedis pulses palpable bilaterally. Posterior tibial pulses palpable  bilaterally. Capillary refill normal to all digits.  No cyanosis or clubbing noted. Pedal hair growth normal.  Neurologic Normal speech. Oriented to person, place, and time. Epicritic sensation to light touch grossly present bilaterally.  Dermatologic Keratotic lesion with central nucleated core noted to left fifth digit.  Central nucleated core noted.  Underlying hammertoe contracture noted semiflexible in nature.  Pain on palpation to the contracture  Orthopedic: Normal joint ROM without pain or crepitus bilaterally. No visible deformities. No bony tenderness.   Radiographs: None Assessment:   1. Hammertoe of left foot    Plan:  Patient was evaluated and treated and all questions answered.  Left fifth porokeratosis with underlying hammertoe contracture -All  questions and concerns were discussed with the patient in extensive detail. -Using chisel blade to handle that I debrided down the lesion to healthy striated tissue.  I also discussed shoe gear modification and prevention technique in extensive detail.  He states understanding.  He will work on obtaining if there is no improvement to discuss surgical fixing of the toe  No follow-ups on file.

## 2022-07-28 ENCOUNTER — Encounter (INDEPENDENT_AMBULATORY_CARE_PROVIDER_SITE_OTHER): Payer: Self-pay

## 2022-12-24 ENCOUNTER — Emergency Department
Admission: EM | Admit: 2022-12-24 | Discharge: 2022-12-24 | Disposition: A | Discharge status: Home / Self Care | Payer: 59 | Attending: Emergency Medicine | Admitting: Emergency Medicine

## 2022-12-24 ENCOUNTER — Other Ambulatory Visit: Payer: Self-pay

## 2022-12-24 ENCOUNTER — Emergency Department: Payer: 59

## 2022-12-24 DIAGNOSIS — M545 Low back pain, unspecified: Secondary | ICD-10-CM | POA: Diagnosis not present

## 2022-12-24 DIAGNOSIS — E119 Type 2 diabetes mellitus without complications: Secondary | ICD-10-CM | POA: Insufficient documentation

## 2022-12-24 DIAGNOSIS — I10 Essential (primary) hypertension: Secondary | ICD-10-CM | POA: Diagnosis not present

## 2022-12-24 MED ORDER — METHOCARBAMOL 500 MG PO TABS: 500.0000 mg | ORAL_TABLET | Freq: Four times a day (QID) | ORAL | 0 refills | Status: AC

## 2022-12-24 MED ORDER — MELOXICAM 15 MG PO TABS: 15.0000 mg | ORAL_TABLET | Freq: Every day | ORAL | 2 refills | Status: AC

## 2022-12-24 NOTE — Discharge Instructions (Addendum)
Please follow up with primary care or orthopedics for symptoms that are not improving over the next week.  Return to the ER for symptoms that change or worsen if unable to schedule an appointment.

## 2022-12-24 NOTE — ED Provider Notes (Signed)
[facility] Provider Note    Event Date/Time   First MD Initiated Contact with Patient 12/24/22 1206     (approximate)   History   Back Pain (Atraumatic lower back pain that radiates to his LEFT hip that began yesterday; Denies loss of bowel/bladder control; States that he works in [employer] and is constantly bending over and picking up things)   HPI  Marcus [name] is a 61 y.o. male with past medical history of diabetes, hypertension, hyperlipidemia, and and as listed in EMR presents to the emergency department for treatment and evaluation of atraumatic lower back pain that radiates into the left hip.  Symptoms started yesterday.  No change in bowel or bladder function.  He is required to do a lot of bending and lifting at his job but no specific injury has occurred.Marland Kitchen      Physical Exam   Triage Vital Signs: ED Triage Vitals  Enc Vitals Group     BP 12/24/22 1142 (!) 194/113     Pulse Rate 12/24/22 1142 85     Resp 12/24/22 1142 19     Temp 12/24/22 1142 98.2 F (36.8 C)     Temp src --      SpO2 12/24/22 1142 97 %     Weight 12/24/22 1139 201 lb (91.2 kg)     Height 12/24/22 1139 5\' 10"  (1.778 m)     Head Circumference --      Peak Flow --      Pain Score 12/24/22 1138 7     Pain Loc --      Pain Edu? --      Excl. in [place]? --     Most recent vital signs: Vitals:   12/24/22 1142  BP: (!) 194/113  Pulse: 85  Resp: 19  Temp: 98.2 F (36.8 C)  SpO2: 97%    General: Awake, no distress.  CV:  Good peripheral perfusion.  Resp:  Normal effort.  Abd:  No distention.  Other:  Left side lower back pain with radiation into the hip.   ED Results / Procedures / Treatments   Labs (all labs ordered are listed, but only abnormal results are displayed) Labs Reviewed - No data to display   EKG  Not indicated   RADIOLOGY  Image and radiology report reviewed and interpreted by me. Radiology report consistent with the  same.  Image of the lumbar spine shows disc space narrowing with degenerative endplate changes  PROCEDURES:  Critical Care performed: No  Procedures   MEDICATIONS ORDERED IN ED:  Medications - No data to display   IMPRESSION / MDM / [place] / ED COURSE   I have reviewed the triage note.  Differential diagnosis includes, but is not limited to, lumbar strain, compression fracture, degenerative disc disease.  Patient's presentation is most consistent with acute illness / injury with system symptoms.  62 year old male presenting to the emergency department for treatment and evaluation of back pain.  See HPI for further details.  X-ray shows degenerative changes but otherwise negative for acute concerns.  Patient will be discharged home with prescriptions for Robaxin and meloxicam.  He is to follow-up with his primary care provider or orthopedics if not improving over the week.  He was encouraged to use a lifting belt and to make sure he is lifting with his legs instead of bending over to pick things up.      FINAL CLINICAL IMPRESSION(S) / ED  DIAGNOSES   Final diagnoses:  Acute bilateral low back pain without sciatica     Rx / DC Orders   ED Discharge Orders          Ordered    methocarbamol (ROBAXIN) 500 MG tablet  4 times daily        12/24/22 1300    meloxicam (MOBIC) 15 MG tablet  Daily        12/24/22 1300             Note:  This document was prepared using Dragon voice recognition software and may include unintentional dictation errors.   Victorino Dike, FNP 12/24/22 Guy Franco, MD 12/25/22 [phone]

## 2022-12-24 NOTE — ED Triage Notes (Signed)
Atraumatic lower back pain that radiates to his LEFT hip that began yesterday; Denies loss of bowel/bladder control; States that he works in [employer] and is constantly bending over and picking up things

## 2023-01-07 ENCOUNTER — Other Ambulatory Visit (INDEPENDENT_AMBULATORY_CARE_PROVIDER_SITE_OTHER): Payer: Self-pay | Admitting: Vascular Surgery

## 2023-01-07 DIAGNOSIS — I739 Peripheral vascular disease, unspecified: Secondary | ICD-10-CM

## 2023-01-18 NOTE — Progress Notes (Unsigned)
MRN : [id number]  Marcus [name] is a 62 y.o. (1961-05-30) male who presents with chief complaint of check circulation.  History of Present Illness:  The patient returns to the office for follow up evaluation of painful lower extremities and diminished pulses. Patient notes the pain is always associated with activity and is very consistent day today. Typically, the pain occurs at less than one block, progress is as activity continues to the point that the patient must stop walking. Resting including standing still for several minutes allowed resumption of the activity and the ability to walk a similar distance before stopping again. Uneven terrain and inclined shorten the distance. The pain has been progressive over the past several years. The patient states his difficulty walking is not having a profound negative impact on quality of life and his daily activities at this time.   The patient denies rest pain or dangling of an extremity off the side of the bed during the night for relief. No open wounds or sores at this time. No prior interventions or surgeries.   No history of back problems or DJD of the lumbar sacral spine.    The patient denies changes in claudication symptoms or new rest pain symptoms.  No new ulcers or wounds of the foot.   The patient's blood pressure has been stable and relatively well controlled. The patient denies amaurosis fugax or recent TIA symptoms. There are no recent neurological changes noted. The patient denies history of DVT, PE or superficial thrombophlebitis. The patient denies recent episodes of angina or shortness of breath.    ABI Rt=1.08 and Lt=1.09 the Rt/Lt toe index is 0.70/0.73 respectively; Doppler signals are biphasic bilaterally (Previous ABI Rt=0.82 and Lt=[place] the Lt toe index is only 0.57; Doppler signals are biphasic bilaterally)   No outpatient medications have been marked as taking for the 01/19/23 encounter (Appointment)  with Gilda Crease, Latina Craver, MD.    Past Medical History:  Diagnosis Date   Anxiety    Diabetes mellitus without complication [facility])    ED (erectile dysfunction)    Hyperlipidemia    Hypertension     Past Surgical History:  Procedure Laterality Date   NO PAST SURGERIES      Social History Social History   Tobacco Use   Smoking status: Former   Smokeless tobacco: Never  Substance Use Topics   Alcohol use: Yes   Drug use: No    Family History Family History  Problem Relation Age of Onset   Cancer Father        Lung   Cancer Mother        spinal   Prostate cancer Neg Hx    Bladder Cancer Neg Hx    Kidney cancer Neg Hx     No Known Allergies   REVIEW OF SYSTEMS (Negative unless checked)  Constitutional: Weight loss  Fever  Chills Cardiac: Chest pain   Chest pressure   Palpitations   Shortness of breath when laying flat   Shortness of breath with exertion. Vascular:  Pain in legs with walking   Pain in legs at rest  History of DVT   Phlebitis   Swelling in legs   Varicose veins   Non-healing ulcers Pulmonary:   Uses home oxygen   Productive cough   Hemoptysis   Wheeze  COPD   Asthma Neurologic:  Dizziness   Seizures   History of stroke     History of TIA  [] Aphasia   [] Vissual changes   [] Weakness or numbness in arm   [] Weakness or numbness in leg Musculoskeletal:   [] Joint swelling   [] Joint pain   [] Low back pain Hematologic:  [] Easy bruising  [] Easy bleeding   [] Hypercoagulable state   [] Anemic Gastrointestinal:  [] Diarrhea   [] Vomiting  [] Gastroesophageal reflux/heartburn   [] Difficulty swallowing. Genitourinary:  [] Chronic kidney disease   [] Difficult urination  [] Frequent urination   [] Blood in urine Skin:  [] Rashes   [] Ulcers  Psychological:  [] History of anxiety   []  History of major depression.  Physical Examination  There were no vitals filed for this visit. There is no height or weight on file to calculate  BMI. Gen: WD/WN, NAD Head: [place]/AT, No temporalis wasting.  Ear/Nose/Throat: Hearing grossly intact, nares w/o erythema or drainage Eyes: PER, EOMI, sclera nonicteric.  Neck: Supple, no masses.  No bruit or JVD.  Pulmonary:  Good air movement, no audible wheezing, no use of accessory muscles.  Cardiac: RRR, normal S1, S2, no Murmurs. Vascular:  mild trophic changes, no open wounds Vessel Right Left  Radial Palpable Palpable  PT Not Palpable Not Palpable  DP Not Palpable Not Palpable  Gastrointestinal: soft, non-distended. No guarding/no peritoneal signs.  Musculoskeletal: M/S 5/5 throughout.  No visible deformity.  Neurologic: CN 2-12 intact. Pain and light touch intact in extremities.  Symmetrical.  Speech is fluent. Motor exam as listed above. Psychiatric: Judgment intact, Mood & affect appropriate for pt's clinical situation. Dermatologic: No rashes or ulcers noted.  No changes consistent with cellulitis.   CBC Lab Results  Component Value Date   WBC 5.8 01/12/2020   HGB 17.5 (H) 01/12/2020   HCT 51.8 01/12/2020   MCV 89.3 01/12/2020   PLT 248 01/12/2020    BMET    Component Value Date/Time   NA 136 01/12/2020 0935   NA 132 (L) 05/11/2012 1137   K 3.9 01/12/2020 0935   K 3.7 05/11/2012 1137   CL 102 01/12/2020 0935   CL 98 05/11/2012 1137   CO2 23 01/12/2020 0935   CO2 26 05/11/2012 1137   GLUCOSE 171 (H) 01/12/2020 0935   GLUCOSE 361 (H) 05/11/2012 1137   BUN 29 (H) 01/12/2020 0935   BUN 25 (H) 05/11/2012 1137   CREATININE 1.42 (H) 01/12/2020 0935   CREATININE 1.36 (H) 05/11/2012 1137   CALCIUM 9.5 01/12/2020 0935   CALCIUM 9.7 05/11/2012 1137   GFRNONAA 53 (L) 01/12/2020 0935   GFRNONAA 59 (L) 05/11/2012 1137   GFRAA >60 01/12/2020 0935   GFRAA >60 05/11/2012 1137   CrCl cannot be calculated (Patient's most recent lab result is older than the maximum 21 days allowed.).  COAG No results found for: "INR", "PROTIME"  Radiology DG Lumbar Spine 2-3  Views  Result Date: 12/24/2022 CLINICAL DATA:  Atraumatic lower back pain radiating to left hip. EXAM: LUMBAR SPINE - 2-3 VIEW COMPARISON:  Spine MRI 07/30/2019 FINDINGS: There are 5 non-rib-bearing lumbar-type vertebral bodies. Vertebral body heights are preserved, without evidence of acute injury. There is grade 1 retrolisthesis of L5 on S1. Alignment is otherwise normal. There is disc space narrowing and degenerative endplate change at L3-L4 through L5-S1. There is overall mild facet arthropathy in the lower lumbar spine. The SI joints are intact. There are dense aortic calcifications. The soft tissues are otherwise unremarkable. IMPRESSION: Disc space narrowing and degenerative endplate change at L3-L4 through L5-S1 with grade 1 retrolisthesis of L5 on S1. Overall mild facet arthropathy in  the lower lumbar spine. No acute finding. Electronically Signed   By: Lesia Hausen M.D.   On: 12/24/2022 12:40     Assessment/Plan 1. PAD (peripheral artery disease)  Recommend:   The patient has evidence of atherosclerosis of the lower extremities with claudication.  The patient does not voice lifestyle limiting changes at this point in time.   Noninvasive studies do not suggest clinically significant change.   No invasive studies, angiography or surgery at this time The patient should continue walking and begin a more formal exercise program.  The patient should continue antiplatelet therapy and aggressive treatment of the lipid abnormalities   No changes in the patient's medications at this time   Continued surveillance is indicated as atherosclerosis is likely to progress with time.     The patient will continue follow up with noninvasive studies as ordered.   - VAS [place] ABI WITH/WO TBI; Future  2. Essential hypertension Continue antihypertensive medications as already ordered, these medications have been reviewed and there are no changes at this time.  3. Type 2 diabetes mellitus with diabetic  peripheral angiopathy without gangrene, with long-term current use of insulin Continue hypoglycemic medications as already ordered, these medications have been reviewed and there are no changes at this time.  Hgb A1C to be monitored as already arranged by primary service  4. Mixed hyperlipidemia Continue statin as ordered and reviewed, no changes at this time    Levora Dredge, MD  01/18/2023 11:16 AM

## 2023-01-19 ENCOUNTER — Encounter (INDEPENDENT_AMBULATORY_CARE_PROVIDER_SITE_OTHER): Payer: Self-pay | Admitting: Vascular Surgery

## 2023-01-19 ENCOUNTER — Ambulatory Visit (INDEPENDENT_AMBULATORY_CARE_PROVIDER_SITE_OTHER): Payer: 59

## 2023-01-19 ENCOUNTER — Ambulatory Visit (INDEPENDENT_AMBULATORY_CARE_PROVIDER_SITE_OTHER): Payer: 59 | Admitting: Vascular Surgery

## 2023-01-19 VITALS — BP 142/84 | HR 75 | Resp 16 | Wt 207.4 lb

## 2023-01-19 DIAGNOSIS — Z794 Long term (current) use of insulin: Secondary | ICD-10-CM

## 2023-01-19 DIAGNOSIS — E782 Mixed hyperlipidemia: Secondary | ICD-10-CM | POA: Diagnosis not present

## 2023-01-19 DIAGNOSIS — E1151 Type 2 diabetes mellitus with diabetic peripheral angiopathy without gangrene: Secondary | ICD-10-CM

## 2023-01-19 DIAGNOSIS — I10 Essential (primary) hypertension: Secondary | ICD-10-CM

## 2023-01-19 DIAGNOSIS — L600 Ingrowing nail: Secondary | ICD-10-CM | POA: Insufficient documentation

## 2023-01-19 DIAGNOSIS — I739 Peripheral vascular disease, unspecified: Secondary | ICD-10-CM | POA: Diagnosis not present

## 2023-01-26 LAB — VAS US ABI WITH/WO TBI
Left ABI: 1.09
Right ABI: 1.08

## 2023-02-03 ENCOUNTER — Ambulatory Visit (INDEPENDENT_AMBULATORY_CARE_PROVIDER_SITE_OTHER): Payer: 59 | Admitting: Podiatry

## 2023-02-03 DIAGNOSIS — Q828 Other specified congenital malformations of skin: Secondary | ICD-10-CM

## 2023-02-03 NOTE — Progress Notes (Signed)
Subjective:  Patient ID: Marcus [name], male    DOB: 10/11/1960,  MRN: [id number]  Chief Complaint  Patient presents with   Nail Problem    62 y.o. male presents with the above complaint.  Patient presents with left fifth digit porokeratotic lesion painful to touch is progressive gotten worse worse with ambulation worse with pressure he would like for me to debride down.  He denies any other acute complaints.  He is not able to do it himself.  He is a diabetic.   Review of Systems: Negative except as noted in the HPI. Denies N/V/F/Ch.  Past Medical History:  Diagnosis Date   Anxiety    Diabetes mellitus without complication (HCC)    ED (erectile dysfunction)    Hyperlipidemia    Hypertension     Current Outpatient Medications:    ACCU-CHEK AVIVA PLUS test strip, USE STRIP TO CHECK GLUCOSE THREE TIMES DAILY, Disp: , Rfl:    ACCU-CHEK FASTCLIX LANCETS MISC, USE TO CHECK BLOOD SUGAR THREE TIMES DAILY, Disp: , Rfl:    allopurinol (ZYLOPRIM) 100 MG tablet, TAKE 1 TABLET BY MOUTH ONCE DAILY TO PREVENT GOUT, Disp: , Rfl:    amLODipine (NORVASC) 5 MG tablet, Take 5 mg by mouth daily. for high blood pressure, Disp: , Rfl:    aspirin EC 81 MG tablet, Take by mouth., Disp: , Rfl:    atorvastatin (LIPITOR) 40 MG tablet, Take 40 mg by mouth daily at 6 PM. , Disp: , Rfl:    furosemide (LASIX) 20 MG tablet, TAKE 1 TABLET BY MOUTH IN THE MORNING FOR HIGH BLOOD PRESSURE. TAKE IN PLACE OF HCTZ., Disp: , Rfl:    gabapentin (NEURONTIN) 300 MG capsule, Take 300 mg by mouth as needed. , Disp: , Rfl:    LANTUS SOLOSTAR 100 UNIT/ML Solostar Pen, Inject into the skin. 31 units (Patient not taking: Reported on 01/16/2022), Disp: , Rfl:    latanoprost (XALATAN) 0.005 % ophthalmic solution, , Disp: , Rfl:    lisinopril (ZESTRIL) 20 MG tablet, Take 20 mg by mouth daily., Disp: , Rfl:    meloxicam (MOBIC) 15 MG tablet, Take 1 tablet (15 mg total) by mouth daily., Disp: 30 tablet, Rfl: 2   methocarbamol  (ROBAXIN) 500 MG tablet, Take 1 tablet (500 mg total) by mouth 4 (four) times daily., Disp: 30 tablet, Rfl: 0   RELION PEN NEEDLES 31G X 6 MM MISC, , Disp: , Rfl:    sildenafil (REVATIO) 20 MG tablet, Take 3 to 5 tablets two hours before intercouse on an empty stomach.  Do not take with nitrates., Disp: 30 tablet, Rfl: 5   TRADJENTA 5 MG TABS tablet, TAKE 1 TABLET BY MOUTH ONCE DAILY FOR DIABETES, Disp: , Rfl:    TRESIBA FLEXTOUCH 100 UNIT/ML SOPN FlexTouch Pen, , Disp: , Rfl:    triamcinolone cream (KENALOG) 0.1 %, , Disp: , Rfl:   Social History   Tobacco Use  Smoking Status Former  Smokeless Tobacco Never    No Known Allergies Objective:  There were no vitals filed for this visit. There is no height or weight on file to calculate BMI. Constitutional Well developed. Well nourished.  Vascular Dorsalis pedis pulses palpable bilaterally. Posterior tibial pulses palpable bilaterally. Capillary refill normal to all digits.  No cyanosis or clubbing noted. Pedal hair growth normal.  Neurologic Normal speech. Oriented to person, place, and time. Epicritic sensation to light touch grossly present bilaterally.  Dermatologic Hyperkeratotic lesion with central nucleated core noted to the  left fifth digit.  Pain on palpation.  No pinpoint bleeding noted  Orthopedic: Normal joint ROM without pain or crepitus bilaterally. No visible deformities. No bony tenderness.   Radiographs: None Assessment:   1. Porokeratosis    Plan:  Patient was evaluated and treated and all questions answered.  Left fifth digit porokeratosis -All questions and concerns were discussed with the patient extensive detail given the amount of pain that he is was having he will benefit from debridement of the lesion using chisel blade of the lesion the lesion was debrided out the transition with no complication -Shoe gear modification was discussed    No follow-ups on file.

## 2023-08-09 ENCOUNTER — Encounter: Payer: Self-pay | Admitting: Podiatry

## 2023-08-10 ENCOUNTER — Telehealth: Payer: Self-pay | Admitting: Podiatry

## 2023-08-10 NOTE — Telephone Encounter (Signed)
Called pt per his request for an appt. I called and he stated he is having left foot pain since last week. He may have injured at the gym.He stated hard to put weight on foot.  I offered appt in [place] office Wednesday 11/13 and he said that it was ok he would call if he wanted to schedule an appt.

## 2023-11-02 DIAGNOSIS — R9431 Abnormal electrocardiogram [ECG] [EKG]: Secondary | ICD-10-CM | POA: Insufficient documentation

## 2023-11-25 ENCOUNTER — Other Ambulatory Visit: Payer: Self-pay

## 2023-11-25 ENCOUNTER — Emergency Department
Admission: EM | Admit: 2023-11-25 | Discharge: 2023-11-25 | Disposition: A | Discharge status: Home / Self Care | Payer: 59 | Attending: Emergency Medicine | Admitting: Emergency Medicine

## 2023-11-25 DIAGNOSIS — I10 Essential (primary) hypertension: Secondary | ICD-10-CM | POA: Insufficient documentation

## 2023-11-25 DIAGNOSIS — E119 Type 2 diabetes mellitus without complications: Secondary | ICD-10-CM | POA: Diagnosis not present

## 2023-11-25 DIAGNOSIS — R110 Nausea: Secondary | ICD-10-CM | POA: Diagnosis not present

## 2023-11-25 DIAGNOSIS — R066 Hiccough: Secondary | ICD-10-CM | POA: Insufficient documentation

## 2023-11-25 LAB — COMPREHENSIVE METABOLIC PANEL
ALT: 26 U/L (ref 0–44)
AST: 34 U/L (ref 15–41)
Albumin: 3.5 g/dL (ref 3.5–5.0)
Alkaline Phosphatase: 38 U/L (ref 38–126)
Anion gap: 15 (ref 5–15)
BUN: 34 mg/dL — ABNORMAL HIGH (ref 8–23)
CO2: 22 mmol/L (ref 22–32)
Calcium: 9 mg/dL (ref 8.9–10.3)
Chloride: 96 mmol/L — ABNORMAL LOW (ref 98–111)
Creatinine, Ser: 1.88 mg/dL — ABNORMAL HIGH (ref 0.61–1.24)
GFR, Estimated: 39 mL/min — ABNORMAL LOW (ref >60)
Glucose, Bld: 200 mg/dL — ABNORMAL HIGH (ref 70–99)
Potassium: 3.4 mmol/L — ABNORMAL LOW (ref 3.5–5.1)
Sodium: 133 mmol/L — ABNORMAL LOW (ref 135–145)
Total Bilirubin: 0.8 mg/dL (ref 0.0–1.2)
Total Protein: 7.8 g/dL (ref 6.5–8.1)

## 2023-11-25 LAB — CBC WITH DIFFERENTIAL/PLATELET
Abs Immature Granulocytes: 0.02 10*3/uL (ref 0.00–0.07)
Basophils Absolute: 0 10*3/uL (ref 0.0–0.1)
Basophils Relative: 0 %
Eosinophils Absolute: 0 10*3/uL (ref 0.0–0.5)
Eosinophils Relative: 0 %
HCT: 50.7 % (ref 39.0–52.0)
Hemoglobin: 17 g/dL (ref 13.0–17.0)
Immature Granulocytes: 0 %
Lymphocytes Relative: 9 %
Lymphs Abs: 0.6 10*3/uL — ABNORMAL LOW (ref 0.7–4.0)
MCH: 29.3 pg (ref 26.0–34.0)
MCHC: 33.5 g/dL (ref 30.0–36.0)
MCV: 87.3 fL (ref 80.0–100.0)
Monocytes Absolute: 0.5 10*3/uL (ref 0.1–1.0)
Monocytes Relative: 7 %
Neutro Abs: 5.4 10*3/uL (ref 1.7–7.7)
Neutrophils Relative %: 84 %
Platelets: 256 10*3/uL (ref 150–400)
RBC: 5.81 MIL/uL (ref 4.22–5.81)
RDW: 15.8 % — ABNORMAL HIGH (ref 11.5–15.5)
WBC: 6.6 10*3/uL (ref 4.0–10.5)
nRBC: 0 % (ref 0.0–0.2)

## 2023-11-25 LAB — TROPONIN I (HIGH SENSITIVITY): Troponin I (High Sensitivity): 14 ng/L (ref <18)

## 2023-11-25 MED ORDER — METOCLOPRAMIDE HCL 5 MG/ML IJ SOLN
10.0000 mg | Freq: Once | INTRAMUSCULAR | Status: AC
  Administered 2023-11-25: 10 mg via INTRAVENOUS
  Filled 2023-11-25: qty 2

## 2023-11-25 MED ORDER — SODIUM CHLORIDE 0.9 % IV BOLUS
1000.0000 mL | Freq: Once | INTRAVENOUS | Status: AC
  Administered 2023-11-25: 1000 mL via INTRAVENOUS

## 2023-11-25 MED ORDER — CHLORPROMAZINE HCL 25 MG/ML IJ SOLN
50.0000 mg | Freq: Once | INTRAMUSCULAR | Status: AC
  Administered 2023-11-25: 50 mg via INTRAMUSCULAR
  Filled 2023-11-25: qty 2

## 2023-11-25 MED ORDER — METOCLOPRAMIDE HCL 10 MG PO TABS: 10.0000 mg | ORAL_TABLET | Freq: Three times a day (TID) | ORAL | 0 refills | Status: AC

## 2023-11-25 NOTE — ED Notes (Signed)
 Pt verbalizes understanding of discharge instructions. Opportunity for questioning and answers were provided. Pt discharged from ED to home.   ? ?

## 2023-11-25 NOTE — ED Triage Notes (Signed)
 Pt sts that he has been having the hiccups since Saturday. Pt sts that he is getting GERD from it and is now spitting up.

## 2023-11-25 NOTE — ED Provider Notes (Signed)
   [facility] Provider Note    Event Date/Time   First MD Initiated Contact with Patient 11/25/23 [phone]     (approximate)   History   Hiccups   HPI  Marcus [name] is a 63 y.o. male with history of diabetes, hypertension, hyperlipidemia, and as listed in EMR presents to the emergency department for evaluation of hiccups since Saturday morning while eating breakfast. Hiccups have been persistent and is causing him to feel nauseated. No relief with water or breath holding.      Physical Exam   Triage Vital Signs: ED Triage Vitals  Encounter Vitals Group     BP 11/25/23 0856 (!) 141/76     Systolic BP Percentile --      Diastolic BP Percentile --      Pulse Rate 11/25/23 0856 (!) 117     Resp 11/25/23 0856 18     Temp 11/25/23 0856 97.9 F (36.6 C)     Temp Source 11/25/23 0856 Oral     SpO2 11/25/23 0856 97 %     Weight 11/25/23 0857 189 lb (85.7 kg)     Height 11/25/23 0857 5\' 10"  (1.778 m)     Head Circumference --      Peak Flow --      Pain Score 11/25/23 0856 0     Pain Loc --      Pain Education --      Exclude from Growth Chart --     Most recent vital signs: Vitals:   11/25/23 0856  BP: (!) 141/76  Pulse: (!) 117  Resp: 18  Temp: 97.9 F (36.6 C)  SpO2: 97%    General: Awake, no distress.  CV:  Good peripheral perfusion.  Resp:  Normal effort.  Abd:  No distention.  Other:  Hiccups persistent during conversation   ED Results / Procedures / Treatments   Labs (all labs ordered are listed, but only abnormal results are displayed) Labs Reviewed - No data to display   EKG  Sinus tachycardia, rate of 113   RADIOLOGY  Image and radiology report reviewed and interpreted by me. Radiology report consistent with the same.  Not indicated.  PROCEDURES:  Critical Care performed: No  Procedures   MEDICATIONS ORDERED IN ED:  Medications - No data to display   IMPRESSION / MDM / ASSESSMENT AND PLAN / ED COURSE    I have reviewed the triage note.  Differential diagnosis includes, but is not limited to, GERD, MI, dissection  Patient's presentation is most consistent with acute presentation with potential threat to life or bodily function.  63 year old male presenting to the emergency department for treatment and evaluation of 4 days of persistent hiccups that started while eating breakfast Saturday.  Patient noted to be tachycardic. EKG shows sinus tachycardia with rate of 113. Patient without history of CAD or GERD.   Plan will be to give Thorazine IM and reassess.      FINAL CLINICAL IMPRESSION(S) / ED DIAGNOSES   Final diagnoses:  None     Rx / DC Orders   ED Discharge Orders     None        Note:  This document was prepared using Dragon voice recognition software and may include unintentional dictation errors.

## 2023-11-30 ENCOUNTER — Encounter: Payer: Self-pay | Admitting: Cardiology

## 2023-11-30 ENCOUNTER — Ambulatory Visit: Payer: 59 | Attending: Cardiology | Admitting: Cardiology

## 2023-11-30 VITALS — BP 108/71 | HR 98 | Ht 69.0 in | Wt 194.4 lb

## 2023-11-30 DIAGNOSIS — I10 Essential (primary) hypertension: Secondary | ICD-10-CM

## 2023-11-30 DIAGNOSIS — R9431 Abnormal electrocardiogram [ECG] [EKG]: Secondary | ICD-10-CM

## 2023-11-30 DIAGNOSIS — R072 Precordial pain: Secondary | ICD-10-CM | POA: Diagnosis not present

## 2023-11-30 MED ORDER — IVABRADINE HCL 5 MG PO TABS: 15.0000 mg | ORAL_TABLET | Freq: Once | ORAL | 0 refills | Status: AC

## 2023-11-30 MED ORDER — METOPROLOL TARTRATE 100 MG PO TABS: 100.0000 mg | ORAL_TABLET | Freq: Once | ORAL | 0 refills | Status: AC

## 2023-11-30 NOTE — Patient Instructions (Addendum)
 Medication Instructions:  No changes at this time.   *If you need a refill on your cardiac medications before your next appointment, please call your pharmacy*   Lab Work: None  If you have labs (blood work) drawn today and your tests are completely normal, you will receive your results only by: MyChart Message (if you have MyChart) OR A paper copy in the mail If you have any lab test that is abnormal or we need to change your treatment, we will call you to review the results.   Testing/Procedures: Your physician has requested that you have an echocardiogram. Echocardiography is a painless test that uses sound waves to create images of your heart. It provides your doctor with information about the size and shape of your heart and how well your heart's chambers and valves are working. This procedure takes approximately one hour. There are no restrictions for this procedure. Please do NOT wear cologne, perfume, aftershave, or lotions (deodorant is allowed). Please arrive 15 minutes prior to your appointment time.  Please note: We ask at that you not bring children with you during ultrasound (echo/ vascular) testing. Due to room size and safety concerns, children are not allowed in the ultrasound rooms during exams. Our front office staff cannot provide observation of children in our lobby area while testing is being conducted. An adult accompanying a patient to their appointment will only be allowed in the ultrasound room at the discretion of the ultrasound technician under special circumstances. We apologize for any inconvenience.    Your cardiac CT will be scheduled at one of the below locations:   [facility] [address] [place], [place] [address] [phone]  OR  [facility] [address] [place], [place] [address] [phone]  OR   [facility] [address] [place], [place]  [address] [phone]  OR   [facility] [address] [place], [place] [address] [phone]  If scheduled at [facility], please arrive at the [facility] and Children's Entrance (Entrance C2) of [facility] 30 minutes prior to test start time. You can use the FREE valet parking offered at entrance C (encouraged to control the heart rate for the test)  Proceed to the [facility] Radiology Department (first floor) to check-in and test prep.  All radiology patients and guests should use entrance C2 at [facility], accessed from [facility], even though the hospital's physical address listed is [address]    If scheduled at [facility] or [facility], please arrive 15 mins early for check-in and test prep.  There is spacious parking and easy access to the radiology department from the [facility] Heart and Vascular entrance. Please enter here and check-in with the desk attendant.   If scheduled at [facility], please arrive 30 minutes early for check-in and test prep.  Please follow these instructions carefully (unless otherwise directed):  An IV will be required for this test and Nitroglycerin will be given.   Hold all erectile dysfunction medications at least 3 days (72 hrs) prior to test. (Ie viagra, cialis, sildenafil, tadalafil, etc)   On the Night Before the Test: Be sure to Drink plenty of water. Do not consume any caffeinated/decaffeinated beverages or chocolate 12 hours prior to your test. Do not take any antihistamines 12 hours prior to your test.  On the Day of the Test: Drink  plenty of water until 1 hour prior to the test. Do not eat any food 1 hour prior to test. You may take your regular medications prior to the test.  Take metoprolol (Lopressor) 100 mg & Ivabradine two hours prior to test. If you take  Furosemide/Hydrochlorothiazide/Spironolactone/Chlorthalidone, please HOLD on the morning of the test. HOLD Norvasc (amlodipine) and Lisinopril the day before and day of testing.  Patients who wear a continuous glucose monitor MUST remove the device prior to scanning.        After the Test: Drink plenty of water. After receiving IV contrast, you may experience a mild flushed feeling. This is normal. On occasion, you may experience a mild rash up to 24 hours after the test. This is not dangerous. If this occurs, you can take Benadryl 25 mg, Zyrtec, Claritin, or Allegra and increase your fluid intake. (Patients taking Tikosyn should avoid Benadryl, and may take Zyrtec, Claritin, or Allegra) If you experience trouble breathing, this can be serious. If it is severe call 911 IMMEDIATELY. If it is mild, please call our office.  We will call to schedule your test 2-4 weeks out understanding that some insurance companies will need an authorization prior to the service being performed.   For more information and frequently asked questions, please visit our website : [email or web address]  For non-scheduling related questions, please contact the cardiac imaging nurse navigator should you have any questions/concerns: Cardiac Imaging Nurse Navigators Direct Office Dial: [phone]   For scheduling needs, including cancellations and rescheduling, please call [place], [phone].    Follow-Up: At [facility], you and your health needs are our priority.  As part of our continuing mission to provide you with exceptional heart care, we have created designated Provider Care Teams.  These Care Teams include your primary Cardiologist (physician) and Advanced Practice Providers (APPs -  Physician Assistants and Nurse Practitioners) who all work together to provide you with the care you need, when you need it.    Your next appointment:   Follow up after testing  Provider:   Debbe Odea, MD

## 2023-11-30 NOTE — Progress Notes (Signed)
 Cardiology Office Note:    Date:  11/30/2023   ID:  Marcus [name], DOB 1961/06/06, MRN [id number]  PCP:  Leanna Sato, MD   [facility] Providers Cardiologist:  None     Referring MD: Leanna Sato, MD   Chief Complaint  Patient presents with   Abnormal ECG    Patient was referred by PCP for abnormal EKG. Patient states that his PCP states that it looked like he may have had an past MI. Patient states that he has been experiencing a lot of  hiccups since last Saturday and he has been experiencing a lot of sleep. Meds reviewed .    History of Present Illness:    Marcus [name] is a 63 y.o. male with a hx of hypertension, hyperlipidemia, diabetes, former smoker x 40+ years presenting due to abnormal ECG.  Patient presented to the ED on 11/25/2023 due to hiccups.  Hiccups were managed with Reglan.  EKG obtained showed sinus tachycardia, heart rate 113, nonspecific ST changes.  He complains of epigastric pain which improves with burping.  His hiccups overall are getting better.  Past Medical History:  Diagnosis Date   Anxiety    Diabetes mellitus without complication (HCC)    ED (erectile dysfunction)    Hyperlipidemia    Hypertension     Past Surgical History:  Procedure Laterality Date   NO PAST SURGERIES      Current Medications: Current Meds  Medication Sig   ivabradine (CORLANOR) 5 MG TABS tablet Take 3 tablets (15 mg total) by mouth once for 1 dose. Take 2 hours prior to your procedure.   metoprolol tartrate (LOPRESSOR) 100 MG tablet Take 1 tablet (100 mg total) by mouth once for 1 dose. Take 2 hours prior to procedure.   TRESIBA FLEXTOUCH 100 UNIT/ML SOPN FlexTouch Pen 40 Units daily.     Allergies:   Patient has no known allergies.   Social History   Socioeconomic History   Marital status: Single    Spouse name: Not on file   Number of children: Not on file   Years of education: Not on file   Highest education level: Not on file   Occupational History   Not on file  Tobacco Use   Smoking status: Former   Smokeless tobacco: Never  Substance and Sexual Activity   Alcohol use: Yes   Drug use: No   Sexual activity: Yes    Birth control/protection: None  Other Topics Concern   Not on file  Social History Narrative   Not on file   Social Drivers of Health   Financial Resource Strain: Not on file  Food Insecurity: Not on file  Transportation Needs: Not on file  Physical Activity: Not on file  Stress: Not on file  Social Connections: Not on file     Family History: The patient's family history includes Cancer in his father and mother. There is no history of Prostate cancer, Bladder Cancer, or Kidney cancer.  ROS:   Please see the history of present illness.     All other systems reviewed and are negative.  EKGs/Labs/Other Studies Reviewed:    The following studies were reviewed today:  EKG Interpretation Date/Time:  Monday November 30 2023 09:36:21 EST Ventricular Rate:  98 PR Interval:  140 QRS Duration:  84 QT Interval:  356 QTC Calculation: 454 R Axis:   29  Text Interpretation: Normal sinus rhythm Normal ECG Confirmed by Debbe Odea ([id number]) on 11/30/2023 9:55:31  AM    Recent Labs: 11/25/2023: ALT 26; BUN 34; Creatinine, Ser 1.88; Hemoglobin 17.0; Platelets 256; Potassium 3.4; Sodium 133  Recent Lipid Panel No results found for: "CHOL", "TRIG", "HDL", "CHOLHDL", "VLDL", "LDLCALC", "LDLDIRECT"   Risk Assessment/Calculations:             Physical Exam:    VS:  BP 108/71   Pulse 98   Ht 5\' 9"  (1.753 m)   Wt 194 lb 6.4 oz (88.2 kg)   SpO2 98%   BMI 28.71 kg/m     Wt Readings from Last 3 Encounters:  11/30/23 194 lb 6.4 oz (88.2 kg)  11/25/23 189 lb (85.7 kg)  01/19/23 207 lb 6.4 oz (94.1 kg)     GEN:  Well nourished, well developed in no acute distress HEENT: Normal NECK: No JVD; No carotid bruits CARDIAC: RRR, no murmurs, rubs, gallops RESPIRATORY:  Clear to auscultation  without rales, wheezing or rhonchi  ABDOMEN: Soft, non-tender, non-distended MUSCULOSKELETAL:  No edema; No deformity  SKIN: Warm and dry NEUROLOGIC:  Alert and oriented x 3 PSYCHIATRIC:  Normal affect   ASSESSMENT:    1. Precordial pain   2. Primary hypertension   3. Abnormal EKG    PLAN:    In order of problems listed above:  Chest pain, could be GI related.  Patient has several cardiac risk factors.  Get echo, get coronary CTA. Hypertension, BP controlled.  Continue Norvasc, lisinopril. History of abnormal ECG, nonspecific ST-T changes.  EKG today was normal.  Patient made aware, reassured.  Plan cardiac testing as in #1 above.  Follow-up after cardiac testing      Medication Adjustments/Labs and Tests Ordered: Current medicines are reviewed at length with the patient today.  Concerns regarding medicines are outlined above.  Orders Placed This Encounter  Procedures   CT CORONARY MORPH W/CTA COR W/SCORE W/CA W/CM &/OR WO/CM   EKG 12-Lead   ECHOCARDIOGRAM COMPLETE   Meds ordered this encounter  Medications   metoprolol tartrate (LOPRESSOR) 100 MG tablet    Sig: Take 1 tablet (100 mg total) by mouth once for 1 dose. Take 2 hours prior to procedure.    Dispense:  1 tablet    Refill:  0   ivabradine (CORLANOR) 5 MG TABS tablet    Sig: Take 3 tablets (15 mg total) by mouth once for 1 dose. Take 2 hours prior to your procedure.    Dispense:  3 tablet    Refill:  0    Patient Instructions  Medication Instructions:  No changes at this time.   *If you need a refill on your cardiac medications before your next appointment, please call your pharmacy*   Lab Work: None  If you have labs (blood work) drawn today and your tests are completely normal, you will receive your results only by: MyChart Message (if you have MyChart) OR A paper copy in the mail If you have any lab test that is abnormal or we need to change your treatment, we will call you to review the  results.   Testing/Procedures: Your physician has requested that you have an echocardiogram. Echocardiography is a painless test that uses sound waves to create images of your heart. It provides your doctor with information about the size and shape of your heart and how well your heart's chambers and valves are working. This procedure takes approximately one hour. There are no restrictions for this procedure. Please do NOT wear cologne, perfume, aftershave, or lotions (deodorant is  allowed). Please arrive 15 minutes prior to your appointment time.  Please note: We ask at that you not bring children with you during ultrasound (echo/ vascular) testing. Due to room size and safety concerns, children are not allowed in the ultrasound rooms during exams. Our front office staff cannot provide observation of children in our lobby area while testing is being conducted. An adult accompanying a patient to their appointment will only be allowed in the ultrasound room at the discretion of the ultrasound technician under special circumstances. We apologize for any inconvenience.    Your cardiac CT will be scheduled at one of the below locations:   [facility] [address] [place], [place] [address] [phone]  OR  [facility] [address] [place], [place] [address] [phone]  OR   [facility] [address] [place], [place] [address] [phone]  OR   [facility] [address] [place], [place] [address] [phone]  If scheduled at [facility], please arrive at the [facility] and Children's Entrance (Entrance C2) of [facility] 30 minutes prior to test start time. You can use the FREE valet parking offered at entrance C (encouraged to control the heart rate for the test)  Proceed to the [facility] Radiology Department (first floor) to check-in and test prep.  All  radiology patients and guests should use entrance C2 at [facility], accessed from [facility], even though the hospital's physical address listed is [address]    If scheduled at [facility] or [facility], please arrive 15 mins early for check-in and test prep.  There is spacious parking and easy access to the radiology department from the [facility] Heart and Vascular entrance. Please enter here and check-in with the desk attendant.   If scheduled at [facility], please arrive 30 minutes early for check-in and test prep.  Please follow these instructions carefully (unless otherwise directed):  An IV will be required for this test and Nitroglycerin will be given.   Hold all erectile dysfunction medications at least 3 days (72 hrs) prior to test. (Ie viagra, cialis, sildenafil, tadalafil, etc)   On the Night Before the Test: Be sure to Drink plenty of water. Do not consume any caffeinated/decaffeinated beverages or chocolate 12 hours prior to your test. Do not take any antihistamines 12 hours prior to your test.  On the Day of the Test: Drink plenty of water until 1 hour prior to the test. Do not eat any food 1 hour prior to test. You may take your regular medications prior to the test.  Take metoprolol (Lopressor) 100 mg & Ivabradine two hours prior to test. If you take Furosemide/Hydrochlorothiazide/Spironolactone/Chlorthalidone, please HOLD on the morning of the test. HOLD Norvasc (amlodipine) and Lisinopril the day before and day of testing.  Patients who wear a continuous glucose monitor MUST remove the device prior to scanning.        After the Test: Drink plenty of water. After receiving IV contrast, you may experience a mild flushed feeling. This is normal. On occasion, you may experience a mild rash up to 24 hours after the test. This is not dangerous. If this occurs, you can take Benadryl  25 mg, Zyrtec, Claritin, or Allegra and increase your fluid intake. (Patients taking Tikosyn should avoid Benadryl, and may take Zyrtec, Claritin, or Allegra) If you experience trouble breathing,  this can be serious. If it is severe call 911 IMMEDIATELY. If it is mild, please call our office.  We will call to schedule your test 2-4 weeks out understanding that some insurance companies will need an authorization prior to the service being performed.   For more information and frequently asked questions, please visit our website : [email or web address]  For non-scheduling related questions, please contact the cardiac imaging nurse navigator should you have any questions/concerns: Cardiac Imaging Nurse Navigators Direct Office Dial: [phone]   For scheduling needs, including cancellations and rescheduling, please call [place], [phone].    Follow-Up: At [facility], you and your health needs are our priority.  As part of our continuing mission to provide you with exceptional heart care, we have created designated Provider Care Teams.  These Care Teams include your primary Cardiologist (physician) and Advanced Practice Providers (APPs -  Physician Assistants and Nurse Practitioners) who all work together to provide you with the care you need, when you need it.    Your next appointment:   Follow up after testing  Provider:   Debbe Odea, MD      Signed, Debbe Odea, MD  11/30/2023 10:35 AM    [facility]

## 2023-12-02 ENCOUNTER — Encounter: Payer: Self-pay | Admitting: Cardiology

## 2023-12-08 ENCOUNTER — Ambulatory Visit (INDEPENDENT_AMBULATORY_CARE_PROVIDER_SITE_OTHER): Payer: 59 | Admitting: Podiatry

## 2023-12-08 DIAGNOSIS — M2042 Other hammer toe(s) (acquired), left foot: Secondary | ICD-10-CM | POA: Diagnosis not present

## 2023-12-08 DIAGNOSIS — M2041 Other hammer toe(s) (acquired), right foot: Secondary | ICD-10-CM

## 2023-12-08 DIAGNOSIS — E119 Type 2 diabetes mellitus without complications: Secondary | ICD-10-CM | POA: Diagnosis not present

## 2023-12-08 NOTE — Progress Notes (Signed)
 Subjective: Marcus [name] presents today referred by Leanna Sato, MD for diabetic foot evaluation.  Patient relates many year history of diabetes.  Patient denies any history of foot wounds.  Patient denies any history of numbness, tingling, burning, pins/needles sensations.  Past Medical History:  Diagnosis Date   Anxiety    Diabetes mellitus without complication [facility])    ED (erectile dysfunction)    Hyperlipidemia    Hypertension     Patient Active Problem List   Diagnosis Date Noted   Leg pain 10/21/2018   PAD (peripheral artery disease) (HCC) 10/21/2018   Diabetes (HCC) 10/21/2018   Hyperlipidemia 10/21/2018   Essential hypertension 10/21/2018   Other malaise and fatigue 02/22/2013   ED (erectile dysfunction) of organic origin 01/18/2013   Incontinence of urine 01/18/2013    Past Surgical History:  Procedure Laterality Date   NO PAST SURGERIES      Current Outpatient Medications on File Prior to Visit  Medication Sig Dispense Refill   ACCU-CHEK AVIVA PLUS test strip USE STRIP TO CHECK GLUCOSE THREE TIMES DAILY     ACCU-CHEK FASTCLIX LANCETS MISC USE TO CHECK BLOOD SUGAR THREE TIMES DAILY     allopurinol (ZYLOPRIM) 100 MG tablet TAKE 1 TABLET BY MOUTH ONCE DAILY TO PREVENT GOUT     amLODipine (NORVASC) 5 MG tablet Take 5 mg by mouth daily. for high blood pressure     aspirin EC 81 MG tablet Take by mouth.     atorvastatin (LIPITOR) 40 MG tablet Take 40 mg by mouth daily at 6 PM.      furosemide (LASIX) 20 MG tablet TAKE 1 TABLET BY MOUTH IN THE MORNING FOR HIGH BLOOD PRESSURE. TAKE IN PLACE OF HCTZ.     gabapentin (NEURONTIN) 300 MG capsule Take 300 mg by mouth as needed.      LANTUS SOLOSTAR 100 UNIT/ML Solostar Pen Inject into the skin. 31 units (Patient not taking: Reported on 11/30/2023)     latanoprost (XALATAN) 0.005 % ophthalmic solution      lisinopril (ZESTRIL) 20 MG tablet Take 20 mg by mouth daily.     meloxicam (MOBIC) 15 MG tablet Take 1 tablet (15  mg total) by mouth daily. 30 tablet 2   methocarbamol (ROBAXIN) 500 MG tablet Take 1 tablet (500 mg total) by mouth 4 (four) times daily. 30 tablet 0   metoCLOPramide (REGLAN) 10 MG tablet Take 1 tablet (10 mg total) by mouth 3 (three) times daily with meals for 10 days. 30 tablet 0   metoprolol tartrate (LOPRESSOR) 100 MG tablet Take 1 tablet (100 mg total) by mouth once for 1 dose. Take 2 hours prior to procedure. 1 tablet 0   RELION PEN NEEDLES 31G X 6 MM MISC      sildenafil (REVATIO) 20 MG tablet Take 3 to 5 tablets two hours before intercouse on an empty stomach.  Do not take with nitrates. 30 tablet 5   TRADJENTA 5 MG TABS tablet TAKE 1 TABLET BY MOUTH ONCE DAILY FOR DIABETES     TRESIBA FLEXTOUCH 100 UNIT/ML SOPN FlexTouch Pen 40 Units daily.     triamcinolone cream (KENALOG) 0.1 %      No current facility-administered medications on file prior to visit.     No Known Allergies  Social History   Occupational History   Not on file  Tobacco Use   Smoking status: Former   Smokeless tobacco: Never  Substance and Sexual Activity   Alcohol use: Yes  Drug use: No   Sexual activity: Yes    Birth control/protection: None    Family History  Problem Relation Age of Onset   Cancer Father        Lung   Cancer Mother        spinal   Prostate cancer Neg Hx    Bladder Cancer Neg Hx    Kidney cancer Neg Hx     Immunization History  Administered Date(s) Administered   Influenza,inj,Quad PF,6+ Mos 09/13/2018   Moderna Sars-Covid-2 Vaccination 01/18/2020, 02/15/2020    Review of systems: Positive Findings in bold print.  Constitutional:  chills, fatigue, fever, sweats, weight change Communication: Nurse, learning disability, sign Presenter, broadcasting, hand writing, iPad/Android device Head: headaches, head injury Eyes: changes in vision, eye pain, glaucoma, cataracts, macular degeneration, diplopia, glare,  light sensitivity, eyeglasses or contacts, blindness Ears nose mouth throat: hearing  impaired, hearing aids,  ringing in ears, deaf, sign language,  vertigo, nosebleeds,  rhinitis,  cold sores, snoring, swollen glands Cardiovascular: HTN, edema, arrhythmia, pacemaker in place, defibrillator in place, chest pain/tightness, chronic anticoagulation, blood clot, heart failure, MI Peripheral Vascular: leg cramps, varicose veins, blood clots, lymphedema, varicosities Respiratory:  asthma, difficulty breathing, denies congestion, SOB, wheezing, cough, emphysema Gastrointestinal: change in appetite or weight, abdominal pain, constipation, diarrhea, nausea, vomiting, vomiting blood, change in bowel habits, abdominal pain, jaundice, rectal bleeding, hemorrhoids, GERD Genitourinary:  nocturia,  pain on urination, polyuria,  blood in urine, Foley catheter, urinary urgency, ESRD on hemodialysis Musculoskeletal: amputation, cramping, stiff joints, painful joints, decreased joint motion, fractures, OA, gout, hemiplegia, paraplegia, uses cane, wheelchair bound, uses walker, uses rollator Skin: +changes in toenails, color change, dryness, itching, mole changes,  rash, wound(s) Neurological: headaches, numbness in feet, paresthesias in feet, burning in feet, fainting,  seizures, change in speech, migraines, memory problems/poor historian, cerebral palsy, weakness, paralysis, CVA, TIA Endocrine: diabetes, hypothyroidism, hyperthyroidism,  goiter, dry mouth, flushing, heat intolerance, cold intolerance,  excessive thirst, denies polyuria,  nocturia Hematological:  easy bleeding, excessive bleeding, easy bruising, enlarged lymph nodes, on long term blood thinner, history of past transusions Allergy/immunological:  hives, eczema, frequent infections, multiple drug allergies, seasonal allergies, transplant recipient, multiple food allergies Psychiatric:  anxiety, depression, mood disorder, suicidal ideations, hallucinations, insomnia  Objective: There were no vitals filed for this visit. Vascular  Examination: Capillary refill time less than 3 seconds x 10 digits.  Dorsalis pedis pulses palpable 2 out of 4.  Posterior tibial pulses palpable 2 out of 4.  Digital hair not present x 10 digits.  Skin temperature gradient WNL b/l.  Dermatological Examination: Skin with normal turgor, texture and tone b/l  Toenails 1-5 b/l discolored, thick, dystrophic with subungual debris and pain with palpation to nailbeds due to thickness of nails.  Semiflexible hammertoe contractures noted 2 through 4 bilaterally  Musculoskeletal: Muscle strength 5/5 to all LE muscle groups.  Neurological: Sensation intact with 10 gram monofilament.  Vibratory sensation intact.  Assessment: NIDDM Encounter for diabetic foot examination Hammertoe contracture bilateral  Plan: Discussed diabetic foot care principles. Literature dispensed on today. Patient to continue soft, supportive shoe gear daily. Patient to report any pedal injuries to medical professional immediately. Follow up one year. Patient/POA to call should there be a concern in the interim. Patient will be scheduled to get diabetic shoes.  Should be scheduled with Trish to obtain diabetic shoes.  He is a high rates of developing wound complication secondary to contracture present with diabetes

## 2023-12-24 ENCOUNTER — Other Ambulatory Visit

## 2024-01-13 ENCOUNTER — Ambulatory Visit: Attending: Cardiology | Admitting: Cardiology

## 2024-01-13 ENCOUNTER — Ambulatory Visit

## 2024-01-13 DIAGNOSIS — I739 Peripheral vascular disease, unspecified: Secondary | ICD-10-CM

## 2024-01-13 DIAGNOSIS — M2141 Flat foot [pes planus] (acquired), right foot: Secondary | ICD-10-CM

## 2024-01-13 DIAGNOSIS — E1151 Type 2 diabetes mellitus with diabetic peripheral angiopathy without gangrene: Secondary | ICD-10-CM

## 2024-01-13 DIAGNOSIS — M2041 Other hammer toe(s) (acquired), right foot: Secondary | ICD-10-CM

## 2024-01-13 NOTE — Progress Notes (Signed)
 Patient presents to the office today for diabetic shoe and insole measuring.  Patient was measured with brannock device to determine size and width for 1 pair of extra depth shoes for 3 pair of insoles.   Documentation of medical necessity will be sent to patient's treating diabetic doctor to verify and sign.   Patient's diabetic provider: Arn Beth MD / [facility] and insoles will be ordered at that time and patient will be notified for an appointment for fitting when they arrive.   Shoe size (per patient): 12 Shoe choice:   [id number] Shoe size ordered:  Ppw ABN signed

## 2024-01-18 ENCOUNTER — Ambulatory Visit (INDEPENDENT_AMBULATORY_CARE_PROVIDER_SITE_OTHER): Payer: 59 | Admitting: Vascular Surgery

## 2024-01-18 ENCOUNTER — Encounter (INDEPENDENT_AMBULATORY_CARE_PROVIDER_SITE_OTHER): Payer: 59

## 2024-02-16 ENCOUNTER — Encounter (INDEPENDENT_AMBULATORY_CARE_PROVIDER_SITE_OTHER): Payer: Self-pay

## 2024-02-18 ENCOUNTER — Telehealth: Payer: Self-pay

## 2024-02-18 NOTE — Telephone Encounter (Signed)
 DM shoe PU appt made for BTON .Aaron Aas Ppw exp 8/7. Shoes placed in BTON bin on 5/22

## 2024-03-11 ENCOUNTER — Other Ambulatory Visit

## 2024-03-18 ENCOUNTER — Ambulatory Visit (INDEPENDENT_AMBULATORY_CARE_PROVIDER_SITE_OTHER)

## 2024-03-18 DIAGNOSIS — E1151 Type 2 diabetes mellitus with diabetic peripheral angiopathy without gangrene: Secondary | ICD-10-CM

## 2024-03-18 DIAGNOSIS — M2042 Other hammer toe(s) (acquired), left foot: Secondary | ICD-10-CM | POA: Diagnosis not present

## 2024-03-18 DIAGNOSIS — M2041 Other hammer toe(s) (acquired), right foot: Secondary | ICD-10-CM

## 2024-03-18 DIAGNOSIS — M2141 Flat foot [pes planus] (acquired), right foot: Secondary | ICD-10-CM

## 2024-03-18 DIAGNOSIS — Z794 Long term (current) use of insulin: Secondary | ICD-10-CM

## 2024-03-18 DIAGNOSIS — I739 Peripheral vascular disease, unspecified: Secondary | ICD-10-CM

## 2024-03-29 ENCOUNTER — Ambulatory Visit: Admitting: Podiatry

## 2024-03-29 DIAGNOSIS — E1151 Type 2 diabetes mellitus with diabetic peripheral angiopathy without gangrene: Secondary | ICD-10-CM | POA: Diagnosis not present

## 2024-03-29 DIAGNOSIS — I96 Gangrene, not elsewhere classified: Secondary | ICD-10-CM | POA: Diagnosis not present

## 2024-03-29 DIAGNOSIS — I999 Unspecified disorder of circulatory system: Secondary | ICD-10-CM

## 2024-03-29 DIAGNOSIS — I739 Peripheral vascular disease, unspecified: Secondary | ICD-10-CM

## 2024-03-29 DIAGNOSIS — Z794 Long term (current) use of insulin: Secondary | ICD-10-CM | POA: Diagnosis not present

## 2024-03-29 NOTE — Progress Notes (Unsigned)
 Subjective:  Patient ID: Marcus [name], male    DOB: [birth year],  MRN: [id number]  Chief Complaint  Patient presents with   Toe Pain    63 y.o. male presents with the above complaint.  Patient presents with right fourth digit dusky changes.  Patient states this came on no one to get it evaluated he is a diabetic.  He does not have any history of peripheral vascular disease.  He states that he got little bit darker and really more painful denies any other acute issues.  Wanted to discuss treatment options for this.   Review of Systems: Negative except as noted in the HPI. Denies N/V/F/Ch.  Past Medical History:  Diagnosis Date   Anxiety    Diabetes mellitus without complication (HCC)    ED (erectile dysfunction)    Hyperlipidemia    Hypertension     Current Outpatient Medications:    ACCU-CHEK AVIVA PLUS test strip, USE STRIP TO CHECK GLUCOSE THREE TIMES DAILY, Disp: , Rfl:    ACCU-CHEK FASTCLIX LANCETS MISC, USE TO CHECK BLOOD SUGAR THREE TIMES DAILY, Disp: , Rfl:    allopurinol (ZYLOPRIM) 100 MG tablet, TAKE 1 TABLET BY MOUTH ONCE DAILY TO PREVENT GOUT, Disp: , Rfl:    amLODipine (NORVASC) 5 MG tablet, Take 5 mg by mouth daily. for high blood pressure, Disp: , Rfl:    aspirin EC 81 MG tablet, Take by mouth., Disp: , Rfl:    atorvastatin (LIPITOR) 40 MG tablet, Take 40 mg by mouth daily at 6 PM. , Disp: , Rfl:    furosemide (LASIX) 20 MG tablet, TAKE 1 TABLET BY MOUTH IN THE MORNING FOR HIGH BLOOD PRESSURE. TAKE IN PLACE OF HCTZ., Disp: , Rfl:    gabapentin (NEURONTIN) 300 MG capsule, Take 300 mg by mouth as needed. , Disp: , Rfl:    LANTUS SOLOSTAR 100 UNIT/ML Solostar Pen, Inject into the skin. 31 units (Patient not taking: Reported on 11/30/2023), Disp: , Rfl:    latanoprost (XALATAN) 0.005 % ophthalmic solution, , Disp: , Rfl:    lisinopril (ZESTRIL) 20 MG tablet, Take 20 mg by mouth daily., Disp: , Rfl:    methocarbamol  (ROBAXIN ) 500 MG tablet, Take 1 tablet (500 mg total) by  mouth 4 (four) times daily., Disp: 30 tablet, Rfl: 0   metoCLOPramide  (REGLAN ) 10 MG tablet, Take 1 tablet (10 mg total) by mouth 3 (three) times daily with meals for 10 days., Disp: 30 tablet, Rfl: 0   metoprolol  tartrate (LOPRESSOR ) 100 MG tablet, Take 1 tablet (100 mg total) by mouth once for 1 dose. Take 2 hours prior to procedure., Disp: 1 tablet, Rfl: 0   RELION PEN NEEDLES 31G X 6 MM MISC, , Disp: , Rfl:    sildenafil  (REVATIO ) 20 MG tablet, Take 3 to 5 tablets two hours before intercouse on an empty stomach.  Do not take with nitrates., Disp: 30 tablet, Rfl: 5   TRADJENTA 5 MG TABS tablet, TAKE 1 TABLET BY MOUTH ONCE DAILY FOR DIABETES, Disp: , Rfl:    TRESIBA FLEXTOUCH 100 UNIT/ML SOPN FlexTouch Pen, 40 Units daily., Disp: , Rfl:    triamcinolone cream (KENALOG) 0.1 %, , Disp: , Rfl:   Social History   Tobacco Use  Smoking Status Former  Smokeless Tobacco Never    No Known Allergies Objective:  There were no vitals filed for this visit. There is no height or weight on file to calculate BMI. Constitutional Well developed. Well nourished.  Vascular Dorsalis pedis pulses  palpable bilaterally. Posterior tibial pulses palpable bilaterally. Capillary refill normal to all digits.  No cyanosis or clubbing noted. Pedal hair growth normal.  Neurologic Normal speech. Oriented to person, place, and time. Epicritic sensation to light touch grossly present bilaterally.  Dermatologic Right fourth digit dusky change noted to the distal fourth toe.  No open wounds or lesion noted.  Appears to be superficial gangrene in nature.  Dry stable  Orthopedic: Normal joint ROM without pain or crepitus bilaterally. No visible deformities. No bony tenderness.   Radiographs: None Assessment:   1. Vascular abnormality   2. Toe gangrene (HCC)   3. PAD (peripheral artery disease) (HCC)   4. Type 2 diabetes mellitus with diabetic peripheral angiopathy without gangrene, with long-term current use of  insulin (HCC)    Plan:  Patient was evaluated and treated and all questions answered.  Right fourth digit dusky/beginning gangrenous changes noted - All questions and concerns were discussed with the patient extensive detail - I will order stat ABIs PVRs to assess the vascular flow to the right lower extremity.  Patient agrees with plan we will proceed with ABIs PVRs - I discussed with him if he regresses go to the emergency room right away he states understanding.  For now this appears to be dry and stable and we can do this as an outpatient but I discussed with him to go to the emergency room if he gets worse he states understanding  No follow-ups on file.

## 2024-04-04 ENCOUNTER — Ambulatory Visit (INDEPENDENT_AMBULATORY_CARE_PROVIDER_SITE_OTHER)

## 2024-04-04 DIAGNOSIS — I999 Unspecified disorder of circulatory system: Secondary | ICD-10-CM

## 2024-04-04 LAB — VAS US ABI WITH/WO TBI: Right ABI: 0.9

## 2024-04-08 ENCOUNTER — Other Ambulatory Visit: Payer: Self-pay | Admitting: Podiatry

## 2024-04-08 DIAGNOSIS — I96 Gangrene, not elsewhere classified: Secondary | ICD-10-CM

## 2024-04-08 DIAGNOSIS — I999 Unspecified disorder of circulatory system: Secondary | ICD-10-CM

## 2024-04-14 ENCOUNTER — Ambulatory Visit (INDEPENDENT_AMBULATORY_CARE_PROVIDER_SITE_OTHER): Admitting: Podiatry

## 2024-04-14 DIAGNOSIS — I96 Gangrene, not elsewhere classified: Secondary | ICD-10-CM | POA: Diagnosis not present

## 2024-04-14 DIAGNOSIS — I999 Unspecified disorder of circulatory system: Secondary | ICD-10-CM

## 2024-04-14 NOTE — Progress Notes (Signed)
 Subjective:  Patient ID: Marcus [name], male    DOB: [birth year],  MRN: [id number]  Chief Complaint  Patient presents with   Vascular abnormality    Vascular abnormality follow up    63 y.o. male presents with the above complaint.  Patient presents with right fourth digit dusky changes.  Patient states this came on no one to get it evaluated he is a diabetic.  He does not have any history of peripheral vascular disease.  He states that he got little bit darker and really more painful denies any other acute issues.  Wanted to discuss treatment options for this.   Review of Systems: Negative except as noted in the HPI. Denies N/V/F/Ch.  Past Medical History:  Diagnosis Date   Anxiety    Diabetes mellitus without complication (HCC)    ED (erectile dysfunction)    Hyperlipidemia    Hypertension     Current Outpatient Medications:    Blood Glucose Monitoring Suppl (ACCU-CHEK AVIVA PLUS) w/Device KIT, ACCU-CHEK AVIVA PLUS w/Device KIT, Disp: , Rfl:    cyclobenzaprine (FLEXERIL) 5 MG tablet, CYCLOBENZAPRINE HCL 5 MG TABS, Disp: , Rfl:    diclofenac (VOLTAREN) 75 MG EC tablet, DICLOFENAC SODIUM 75 MG TBEC, Disp: , Rfl:    mometasone (NASONEX) 50 MCG/ACT nasal spray, Nasonex 50 mcg/actuation spray,non-aerosol, Disp: , Rfl:    pneumococcal 20-valent conjugate vaccine (PREVNAR 20) 0.5 ML injection, PREVNAR 20 0.5 ML SUSY, Disp: , Rfl:    ACCU-CHEK AVIVA PLUS test strip, USE STRIP TO CHECK GLUCOSE THREE TIMES DAILY, Disp: , Rfl:    ACCU-CHEK FASTCLIX LANCETS MISC, USE TO CHECK BLOOD SUGAR THREE TIMES DAILY, Disp: , Rfl:    amLODipine (NORVASC) 5 MG tablet, Take 5 mg by mouth daily. for high blood pressure, Disp: , Rfl:    aspirin EC 81 MG tablet, Take by mouth., Disp: , Rfl:    atorvastatin (LIPITOR) 40 MG tablet, Take 40 mg by mouth daily at 6 PM. , Disp: , Rfl:    furosemide (LASIX) 20 MG tablet, TAKE 1 TABLET BY MOUTH IN THE MORNING FOR HIGH BLOOD PRESSURE. TAKE IN PLACE OF HCTZ., Disp: ,  Rfl:    gabapentin (NEURONTIN) 300 MG capsule, Take 300 mg by mouth as needed. , Disp: , Rfl:    JARDIANCE 25 MG TABS tablet, Take 25 mg by mouth daily., Disp: , Rfl:    LANTUS SOLOSTAR 100 UNIT/ML Solostar Pen, Inject into the skin. 31 units (Patient not taking: Reported on 11/30/2023), Disp: , Rfl:    latanoprost (XALATAN) 0.005 % ophthalmic solution, , Disp: , Rfl:    lisinopril (ZESTRIL) 20 MG tablet, Take 20 mg by mouth daily., Disp: , Rfl:    methocarbamol  (ROBAXIN ) 500 MG tablet, Take 1 tablet (500 mg total) by mouth 4 (four) times daily., Disp: 30 tablet, Rfl: 0   metoCLOPramide  (REGLAN ) 10 MG tablet, Take 1 tablet (10 mg total) by mouth 3 (three) times daily with meals for 10 days., Disp: 30 tablet, Rfl: 0   metoprolol  tartrate (LOPRESSOR ) 100 MG tablet, Take 1 tablet (100 mg total) by mouth once for 1 dose. Take 2 hours prior to procedure., Disp: 1 tablet, Rfl: 0   RELION PEN NEEDLES 31G X 6 MM MISC, , Disp: , Rfl:    TRADJENTA 5 MG TABS tablet, TAKE 1 TABLET BY MOUTH ONCE DAILY FOR DIABETES, Disp: , Rfl:    TRESIBA FLEXTOUCH 100 UNIT/ML SOPN FlexTouch Pen, 40 Units daily., Disp: , Rfl:    triamcinolone  cream (KENALOG) 0.1 %, , Disp: , Rfl:   Social History   Tobacco Use  Smoking Status Former  Smokeless Tobacco Never    Allergies  Allergen Reactions   Hydrochlorothiazide     Other Reaction(s): has gout   Objective:  There were no vitals filed for this visit. There is no height or weight on file to calculate BMI. Constitutional Well developed. Well nourished.  Vascular Dorsalis pedis pulses palpable bilaterally. Posterior tibial pulses palpable bilaterally. Capillary refill normal to all digits.  No cyanosis or clubbing noted. Pedal hair growth normal.  Neurologic Normal speech. Oriented to person, place, and time. Epicritic sensation to light touch grossly present bilaterally.  Dermatologic Right fourth digit dusky change noted to the distal fourth toe.  No open wounds  or lesion noted.  Appears to be superficial gangrene in nature.  Dry stable  Orthopedic: Normal joint ROM without pain or crepitus bilaterally. No visible deformities. No bony tenderness.   Radiographs: None Assessment:   No diagnosis found.  Plan:  Patient was evaluated and treated and all questions answered.  Right fourth digit dusky/beginning gangrenous changes noted - All questions and concerns were discussed with the patient extensive detail - ABIs PVRs were ordered which shows monophasic flow to both lower extremity.  Patient scheduled to see vein and vascular on Monday.  Will continue to follow - I discussed with him if he regresses go to the emergency room right away he states understanding.  For now this appears to be dry and stable and we can do this as an outpatient but I discussed with him to go to the emergency room if he gets worse he states understanding  No follow-ups on file.

## 2024-04-18 ENCOUNTER — Ambulatory Visit (INDEPENDENT_AMBULATORY_CARE_PROVIDER_SITE_OTHER): Admitting: Vascular Surgery

## 2024-04-18 ENCOUNTER — Encounter (INDEPENDENT_AMBULATORY_CARE_PROVIDER_SITE_OTHER): Payer: Self-pay | Admitting: Vascular Surgery

## 2024-04-18 VITALS — BP 162/89 | HR 83 | Ht 69.0 in | Wt 203.0 lb

## 2024-04-18 DIAGNOSIS — I739 Peripheral vascular disease, unspecified: Secondary | ICD-10-CM | POA: Diagnosis not present

## 2024-04-18 DIAGNOSIS — Z794 Long term (current) use of insulin: Secondary | ICD-10-CM | POA: Diagnosis not present

## 2024-04-18 DIAGNOSIS — E1151 Type 2 diabetes mellitus with diabetic peripheral angiopathy without gangrene: Secondary | ICD-10-CM

## 2024-04-18 DIAGNOSIS — I10 Essential (primary) hypertension: Secondary | ICD-10-CM | POA: Diagnosis not present

## 2024-04-18 NOTE — H&P (View-Only) (Signed)
 Subjective:    Patient ID: Marcus [name], male    DOB: Feb 06, 1961, 63 y.o.   MRN: [id number] Chief Complaint  Patient presents with   LS 4.22.24 gs/fb. consult. vascular abnormality + toe gangr    Marcus [name] is a 63 yo male who presents to clinic today as a referral from Dr Franky Blanch in Podiatry for poor results post bilateral lower extremity arterial duplex ultrasounds with ABI's. Patient over the past few months has developed on his right foot early gangrenous changes to his 4th toe. He also has pain to the plantar aspect of his right foot great toe area. He endorses pain while standing and walking today. He endorses pain at rest occasionally to his toes on bilateral feet. Right greater than left. 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     Review of Systems  Constitutional: Negative.   Skin:  Positive for color change and wound.       Positive Gangrene to 4th toe on right foot. Red area to the plantar region of his foot below great toe.   All other systems reviewed and are negative.      Objective:   Physical Exam Vitals reviewed.  Constitutional:      Appearance: Normal appearance. He is normal weight.  HENT:     Head: Normocephalic.  Eyes:     Pupils: Pupils are equal, round, and reactive to light.  Cardiovascular:     Rate and Rhythm: Normal rate and regular rhythm.     Pulses: Normal pulses.     Heart sounds: Normal heart sounds.  Pulmonary:     Effort: Pulmonary effort is normal.     Breath sounds: Normal breath sounds.  Abdominal:     General: Abdomen is flat. Bowel sounds are normal.     Palpations: Abdomen is soft.  Musculoskeletal:        General: Tenderness present.     Comments: Bilateral pain to his toes. Gangrene to his 4th toe on right foot.   Skin:    General: Skin is warm and dry.     Capillary Refill: Capillary refill takes more than 3 seconds.  Neurological:     General: No focal deficit present.     Mental Status: He is alert and  oriented to person, place, and time. Mental status is at baseline.  Psychiatric:        Mood and Affect: Mood normal.        Behavior: Behavior normal.        Thought Content: Thought content normal.        Judgment: Judgment normal.     BP (!) 162/89   Pulse 83   Ht 5' 9 (1.753 m)   Wt 203 lb (92.1 kg)   BMI 29.98 kg/m   Past Medical History:  Diagnosis Date   Anxiety    Diabetes mellitus without complication (HCC)    ED (erectile dysfunction)    Hyperlipidemia    Hypertension     Social History   Socioeconomic History   Marital status: Single    Spouse name: Not on file   Number of children: Not on file   Years of education: Not on file   Highest education level: Not on file  Occupational History   Not on file  Tobacco Use   Smoking status: Former   Smokeless tobacco: Never  Substance and Sexual Activity   Alcohol use: Yes   Drug use: No  Sexual activity: Yes    Birth control/protection: None  Other Topics Concern   Not on file  Social History Narrative   Not on file   Social Drivers of Health   Financial Resource Strain: Not on file  Food Insecurity: Not on file  Transportation Needs: Not on file  Physical Activity: Not on file  Stress: Not on file  Social Connections: Not on file  Intimate Partner Violence: Not on file    Past Surgical History:  Procedure Laterality Date   NO PAST SURGERIES      Family History  Problem Relation Age of Onset   Cancer Father        Lung   Cancer Mother        spinal   Prostate cancer Neg Hx    Bladder Cancer Neg Hx    Kidney cancer Neg Hx     Allergies  Allergen Reactions   Hydrochlorothiazide     Other Reaction(s): has gout       Latest Ref Rng & Units 11/25/2023   11:50 AM 01/12/2020    9:35 AM 05/11/2012   11:37 AM  CBC  WBC 4.0 - 10.5 K/uL 6.6  5.8  7.3   Hemoglobin 13.0 - 17.0 g/dL 82.9  82.4  82.0   Hematocrit 39.0 - 52.0 % 50.7  51.8  52.4   Platelets 150 - 400 K/uL 256  248  300        CMP     Component Value Date/Time   NA 133 (L) 11/25/2023 1150   NA 132 (L) 05/11/2012 1137   K 3.4 (L) 11/25/2023 1150   K 3.7 05/11/2012 1137   CL 96 (L) 11/25/2023 1150   CL 98 05/11/2012 1137   CO2 22 11/25/2023 1150   CO2 26 05/11/2012 1137   GLUCOSE 200 (H) 11/25/2023 1150   GLUCOSE 361 (H) 05/11/2012 1137   BUN 34 (H) 11/25/2023 1150   BUN 25 (H) 05/11/2012 1137   CREATININE 1.88 (H) 11/25/2023 1150   CREATININE 1.36 (H) 05/11/2012 1137   CALCIUM 9.0 11/25/2023 1150   CALCIUM 9.7 05/11/2012 1137   PROT 7.8 11/25/2023 1150   ALBUMIN 3.5 11/25/2023 1150   AST 34 11/25/2023 1150   ALT 26 11/25/2023 1150   ALKPHOS 38 11/25/2023 1150   BILITOT 0.8 11/25/2023 1150   GFRNONAA 39 (L) 11/25/2023 1150   GFRNONAA 59 (L) 05/11/2012 1137     VAS US  ABI WITH/WO TBI Result Date: 04/04/2024  LOWER EXTREMITY DOPPLER STUDY Patient Name:  Marcus [name]  Date of Exam:   04/04/2024 Medical Rec #: [id number]        Accession #:    [id number] Date of Birth: 1961/09/10       Patient Gender: M Patient Age:   9 years Exam Location:  [facility] Procedure:      VAS US  ABI WITH/WO TBI Referring Phys: KEVIN PATEL --------------------------------------------------------------------------------  Indications: Peripheral artery disease. High Risk Factors: Hypertension, hyperlipidemia, Diabetes. Other Factors: Discoloration of right fourth toe.  Performing Technologist: Donnice Charnley RVT  Examination Guidelines: A complete evaluation includes at minimum, Doppler waveform signals and systolic blood pressure reading at the level of bilateral brachial, anterior tibial, and posterior tibial arteries, when vessel segments are accessible. Bilateral testing is considered an integral part of a complete examination. Photoelectric Plethysmograph (PPG) waveforms and toe systolic pressure readings are included as required and additional duplex testing as needed. Limited examinations for reoccurring  indications may be  performed as noted.  ABI Findings: +---------+------------------+-----+----------+-------+ Right    Rt Pressure (mmHg)IndexWaveform  Comment +---------+------------------+-----+----------+-------+ Brachial 196                                      +---------+------------------+-----+----------+-------+ PTA      176               0.90 monophasic        +---------+------------------+-----+----------+-------+ DP       154               0.79 monophasic        +---------+------------------+-----+----------+-------+ Great Toe84                0.43                   +---------+------------------+-----+----------+-------+ +---------+------------------+-----+----------+-------+ Left     Lt Pressure (mmHg)IndexWaveform  Comment +---------+------------------+-----+----------+-------+ Brachial 189                                      +---------+------------------+-----+----------+-------+ ATA      255               1.30 monophasic        +---------+------------------+-----+----------+-------+ PTA      255               1.30 monophasic        +---------+------------------+-----+----------+-------+ Great Toe59                0.30                   +---------+------------------+-----+----------+-------+ +-------+----------------+-----------+------------+------------+ ABI/TBIToday's ABI     Today's TBIPrevious ABIPrevious TBI +-------+----------------+-----------+------------+------------+ Right  0.90            0.43       1.08        0.70         +-------+----------------+-----------+------------+------------+ Left   Non compressible0.30       1.09        0.73         +-------+----------------+-----------+------------+------------+  Arterial wall calcification precludes accurate ankle pressures and ABIs. PPG tracings display appropriate pulsatility. Right ABIs appear decreased compared to prior study on 01/26/2023. Bilateral TBIs appear  decreased compared to prior study on 01/26/2023.  Summary: Right: Resting right ankle-brachial index indicates mild right lower extremity arterial disease. Doppler waveforms and TBIs suggest significant arterial occlusive disease. Left: Resting left ankle-brachial index indicates noncompressible left lower extremity arteries. Doppler waveforms and TBIs suggest significant arterial occlusive disease.  *See table(s) above for measurements and observations.  Electronically signed by Selinda Gu MD on 04/04/2024 at 1:02:09 PM.    Final        Assessment & Plan:   1. PAD (peripheral artery disease) (HCC) (Primary) Patient underwent Bilateral lower extremity arterial duplex ultrasounds with ABI's.  Right ABI Today  0.90  Previous ABI  1.08 now with monophasic Wave forms Left   ABI  Today Non Compressible   Previous ABI 1.09  Now with monophasic wave forms.   Today's TBI's were significantly reduced as well.      Recommend:  The patient has evidence of severe atherosclerotic changes of both lower extremities associated with ulceration and tissue loss of the right foot.  This represents a limb threatening ischemia  and places the patient at the risk for right limb loss.  Patient should undergo angiography of the right lower extremity with the hope for intervention for limb salvage.  The risks and benefits as well as the alternative therapies was discussed in detail with the patient.  All questions were answered.  Patient agrees to proceed with right lower extremity angiography.  The patient will follow up with me in the office after the procedure.   2. Essential hypertension Continue antihypertensive medications as already ordered, these medications have been reviewed and there are no changes at this time.  3. Type 2 diabetes mellitus with diabetic peripheral angiopathy without gangrene, with long-term current use of insulin (HCC) Continue hypoglycemic medications as already ordered, these medications  have been reviewed and there are no changes at this time.  Hgb A1C to be monitored as already arranged by primary service   Current Outpatient Medications on File Prior to Visit  Medication Sig Dispense Refill   ACCU-CHEK AVIVA PLUS test strip USE STRIP TO CHECK GLUCOSE THREE TIMES DAILY     ACCU-CHEK FASTCLIX LANCETS MISC USE TO CHECK BLOOD SUGAR THREE TIMES DAILY     amLODipine (NORVASC) 5 MG tablet Take 5 mg by mouth daily. for high blood pressure     aspirin EC 81 MG tablet Take by mouth.     atorvastatin (LIPITOR) 40 MG tablet Take 40 mg by mouth daily at 6 PM.      Blood Glucose Monitoring Suppl (ACCU-CHEK AVIVA PLUS) w/Device KIT ACCU-CHEK AVIVA PLUS w/Device KIT     cyclobenzaprine (FLEXERIL) 5 MG tablet CYCLOBENZAPRINE HCL 5 MG TABS     diclofenac (VOLTAREN) 75 MG EC tablet DICLOFENAC SODIUM 75 MG TBEC     furosemide (LASIX) 20 MG tablet TAKE 1 TABLET BY MOUTH IN THE MORNING FOR HIGH BLOOD PRESSURE. TAKE IN PLACE OF HCTZ.     gabapentin (NEURONTIN) 300 MG capsule Take 300 mg by mouth as needed.      JARDIANCE 25 MG TABS tablet Take 25 mg by mouth daily.     LANTUS SOLOSTAR 100 UNIT/ML Solostar Pen Inject into the skin. 31 units (Patient not taking: Reported on 11/30/2023)     latanoprost (XALATAN) 0.005 % ophthalmic solution      lisinopril (ZESTRIL) 20 MG tablet Take 20 mg by mouth daily.     methocarbamol  (ROBAXIN ) 500 MG tablet Take 1 tablet (500 mg total) by mouth 4 (four) times daily. 30 tablet 0   metoCLOPramide  (REGLAN ) 10 MG tablet Take 1 tablet (10 mg total) by mouth 3 (three) times daily with meals for 10 days. 30 tablet 0   metoprolol  tartrate (LOPRESSOR ) 100 MG tablet Take 1 tablet (100 mg total) by mouth once for 1 dose. Take 2 hours prior to procedure. 1 tablet 0   mometasone (NASONEX) 50 MCG/ACT nasal spray Nasonex 50 mcg/actuation spray,non-aerosol     pneumococcal 20-valent conjugate vaccine (PREVNAR 20) 0.5 ML injection PREVNAR 20 0.5 ML SUSY     RELION PEN NEEDLES  31G X 6 MM MISC      TRADJENTA 5 MG TABS tablet TAKE 1 TABLET BY MOUTH ONCE DAILY FOR DIABETES     TRESIBA FLEXTOUCH 100 UNIT/ML SOPN FlexTouch Pen 40 Units daily.     triamcinolone cream (KENALOG) 0.1 %      No current facility-administered medications on file prior to visit.    There are no Patient Instructions on file for this visit. No follow-ups on file.   Larance Ratledge R  Elisabeth, NP

## 2024-04-18 NOTE — Progress Notes (Signed)
 Subjective:    Patient ID: Marcus [name], male    DOB: Feb 06, 1961, 63 y.o.   MRN: [id number] Chief Complaint  Patient presents with   LS 4.22.24 gs/fb. consult. vascular abnormality + toe gangr    Marcus [name] is a 63 yo male who presents to clinic today as a referral from Dr Franky Blanch in Podiatry for poor results post bilateral lower extremity arterial duplex ultrasounds with ABI's. Patient over the past few months has developed on his right foot early gangrenous changes to his 4th toe. He also has pain to the plantar aspect of his right foot great toe area. He endorses pain while standing and walking today. He endorses pain at rest occasionally to his toes on bilateral feet. Right greater than left. 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     Review of Systems  Constitutional: Negative.   Skin:  Positive for color change and wound.       Positive Gangrene to 4th toe on right foot. Red area to the plantar region of his foot below great toe.   All other systems reviewed and are negative.      Objective:   Physical Exam Vitals reviewed.  Constitutional:      Appearance: Normal appearance. He is normal weight.  HENT:     Head: Normocephalic.  Eyes:     Pupils: Pupils are equal, round, and reactive to light.  Cardiovascular:     Rate and Rhythm: Normal rate and regular rhythm.     Pulses: Normal pulses.     Heart sounds: Normal heart sounds.  Pulmonary:     Effort: Pulmonary effort is normal.     Breath sounds: Normal breath sounds.  Abdominal:     General: Abdomen is flat. Bowel sounds are normal.     Palpations: Abdomen is soft.  Musculoskeletal:        General: Tenderness present.     Comments: Bilateral pain to his toes. Gangrene to his 4th toe on right foot.   Skin:    General: Skin is warm and dry.     Capillary Refill: Capillary refill takes more than 3 seconds.  Neurological:     General: No focal deficit present.     Mental Status: He is alert and  oriented to person, place, and time. Mental status is at baseline.  Psychiatric:        Mood and Affect: Mood normal.        Behavior: Behavior normal.        Thought Content: Thought content normal.        Judgment: Judgment normal.     BP (!) 162/89   Pulse 83   Ht 5' 9 (1.753 m)   Wt 203 lb (92.1 kg)   BMI 29.98 kg/m   Past Medical History:  Diagnosis Date   Anxiety    Diabetes mellitus without complication (HCC)    ED (erectile dysfunction)    Hyperlipidemia    Hypertension     Social History   Socioeconomic History   Marital status: Single    Spouse name: Not on file   Number of children: Not on file   Years of education: Not on file   Highest education level: Not on file  Occupational History   Not on file  Tobacco Use   Smoking status: Former   Smokeless tobacco: Never  Substance and Sexual Activity   Alcohol use: Yes   Drug use: No  Sexual activity: Yes    Birth control/protection: None  Other Topics Concern   Not on file  Social History Narrative   Not on file   Social Drivers of Health   Financial Resource Strain: Not on file  Food Insecurity: Not on file  Transportation Needs: Not on file  Physical Activity: Not on file  Stress: Not on file  Social Connections: Not on file  Intimate Partner Violence: Not on file    Past Surgical History:  Procedure Laterality Date   NO PAST SURGERIES      Family History  Problem Relation Age of Onset   Cancer Father        Lung   Cancer Mother        spinal   Prostate cancer Neg Hx    Bladder Cancer Neg Hx    Kidney cancer Neg Hx     Allergies  Allergen Reactions   Hydrochlorothiazide     Other Reaction(s): has gout       Latest Ref Rng & Units 11/25/2023   11:50 AM 01/12/2020    9:35 AM 05/11/2012   11:37 AM  CBC  WBC 4.0 - 10.5 K/uL 6.6  5.8  7.3   Hemoglobin 13.0 - 17.0 g/dL 82.9  82.4  82.0   Hematocrit 39.0 - 52.0 % 50.7  51.8  52.4   Platelets 150 - 400 K/uL 256  248  300        CMP     Component Value Date/Time   NA 133 (L) 11/25/2023 1150   NA 132 (L) 05/11/2012 1137   K 3.4 (L) 11/25/2023 1150   K 3.7 05/11/2012 1137   CL 96 (L) 11/25/2023 1150   CL 98 05/11/2012 1137   CO2 22 11/25/2023 1150   CO2 26 05/11/2012 1137   GLUCOSE 200 (H) 11/25/2023 1150   GLUCOSE 361 (H) 05/11/2012 1137   BUN 34 (H) 11/25/2023 1150   BUN 25 (H) 05/11/2012 1137   CREATININE 1.88 (H) 11/25/2023 1150   CREATININE 1.36 (H) 05/11/2012 1137   CALCIUM 9.0 11/25/2023 1150   CALCIUM 9.7 05/11/2012 1137   PROT 7.8 11/25/2023 1150   ALBUMIN 3.5 11/25/2023 1150   AST 34 11/25/2023 1150   ALT 26 11/25/2023 1150   ALKPHOS 38 11/25/2023 1150   BILITOT 0.8 11/25/2023 1150   GFRNONAA 39 (L) 11/25/2023 1150   GFRNONAA 59 (L) 05/11/2012 1137     VAS US  ABI WITH/WO TBI Result Date: 04/04/2024  LOWER EXTREMITY DOPPLER STUDY Patient Name:  Marcus [name]  Date of Exam:   04/04/2024 Medical Rec #: [id number]        Accession #:    [id number] Date of Birth: 1961/09/10       Patient Gender: M Patient Age:   9 years Exam Location:  [facility] Procedure:      VAS US  ABI WITH/WO TBI Referring Phys: KEVIN PATEL --------------------------------------------------------------------------------  Indications: Peripheral artery disease. High Risk Factors: Hypertension, hyperlipidemia, Diabetes. Other Factors: Discoloration of right fourth toe.  Performing Technologist: Donnice Charnley RVT  Examination Guidelines: A complete evaluation includes at minimum, Doppler waveform signals and systolic blood pressure reading at the level of bilateral brachial, anterior tibial, and posterior tibial arteries, when vessel segments are accessible. Bilateral testing is considered an integral part of a complete examination. Photoelectric Plethysmograph (PPG) waveforms and toe systolic pressure readings are included as required and additional duplex testing as needed. Limited examinations for reoccurring  indications may be  performed as noted.  ABI Findings: +---------+------------------+-----+----------+-------+ Right    Rt Pressure (mmHg)IndexWaveform  Comment +---------+------------------+-----+----------+-------+ Brachial 196                                      +---------+------------------+-----+----------+-------+ PTA      176               0.90 monophasic        +---------+------------------+-----+----------+-------+ DP       154               0.79 monophasic        +---------+------------------+-----+----------+-------+ Great Toe84                0.43                   +---------+------------------+-----+----------+-------+ +---------+------------------+-----+----------+-------+ Left     Lt Pressure (mmHg)IndexWaveform  Comment +---------+------------------+-----+----------+-------+ Brachial 189                                      +---------+------------------+-----+----------+-------+ ATA      255               1.30 monophasic        +---------+------------------+-----+----------+-------+ PTA      255               1.30 monophasic        +---------+------------------+-----+----------+-------+ Great Toe59                0.30                   +---------+------------------+-----+----------+-------+ +-------+----------------+-----------+------------+------------+ ABI/TBIToday's ABI     Today's TBIPrevious ABIPrevious TBI +-------+----------------+-----------+------------+------------+ Right  0.90            0.43       1.08        0.70         +-------+----------------+-----------+------------+------------+ Left   Non compressible0.30       1.09        0.73         +-------+----------------+-----------+------------+------------+  Arterial wall calcification precludes accurate ankle pressures and ABIs. PPG tracings display appropriate pulsatility. Right ABIs appear decreased compared to prior study on 01/26/2023. Bilateral TBIs appear  decreased compared to prior study on 01/26/2023.  Summary: Right: Resting right ankle-brachial index indicates mild right lower extremity arterial disease. Doppler waveforms and TBIs suggest significant arterial occlusive disease. Left: Resting left ankle-brachial index indicates noncompressible left lower extremity arteries. Doppler waveforms and TBIs suggest significant arterial occlusive disease.  *See table(s) above for measurements and observations.  Electronically signed by Selinda Gu MD on 04/04/2024 at 1:02:09 PM.    Final        Assessment & Plan:   1. PAD (peripheral artery disease) (HCC) (Primary) Patient underwent Bilateral lower extremity arterial duplex ultrasounds with ABI's.  Right ABI Today  0.90  Previous ABI  1.08 now with monophasic Wave forms Left   ABI  Today Non Compressible   Previous ABI 1.09  Now with monophasic wave forms.   Today's TBI's were significantly reduced as well.      Recommend:  The patient has evidence of severe atherosclerotic changes of both lower extremities associated with ulceration and tissue loss of the right foot.  This represents a limb threatening ischemia  and places the patient at the risk for right limb loss.  Patient should undergo angiography of the right lower extremity with the hope for intervention for limb salvage.  The risks and benefits as well as the alternative therapies was discussed in detail with the patient.  All questions were answered.  Patient agrees to proceed with right lower extremity angiography.  The patient will follow up with me in the office after the procedure.   2. Essential hypertension Continue antihypertensive medications as already ordered, these medications have been reviewed and there are no changes at this time.  3. Type 2 diabetes mellitus with diabetic peripheral angiopathy without gangrene, with long-term current use of insulin (HCC) Continue hypoglycemic medications as already ordered, these medications  have been reviewed and there are no changes at this time.  Hgb A1C to be monitored as already arranged by primary service   Current Outpatient Medications on File Prior to Visit  Medication Sig Dispense Refill   ACCU-CHEK AVIVA PLUS test strip USE STRIP TO CHECK GLUCOSE THREE TIMES DAILY     ACCU-CHEK FASTCLIX LANCETS MISC USE TO CHECK BLOOD SUGAR THREE TIMES DAILY     amLODipine (NORVASC) 5 MG tablet Take 5 mg by mouth daily. for high blood pressure     aspirin EC 81 MG tablet Take by mouth.     atorvastatin (LIPITOR) 40 MG tablet Take 40 mg by mouth daily at 6 PM.      Blood Glucose Monitoring Suppl (ACCU-CHEK AVIVA PLUS) w/Device KIT ACCU-CHEK AVIVA PLUS w/Device KIT     cyclobenzaprine (FLEXERIL) 5 MG tablet CYCLOBENZAPRINE HCL 5 MG TABS     diclofenac (VOLTAREN) 75 MG EC tablet DICLOFENAC SODIUM 75 MG TBEC     furosemide (LASIX) 20 MG tablet TAKE 1 TABLET BY MOUTH IN THE MORNING FOR HIGH BLOOD PRESSURE. TAKE IN PLACE OF HCTZ.     gabapentin (NEURONTIN) 300 MG capsule Take 300 mg by mouth as needed.      JARDIANCE 25 MG TABS tablet Take 25 mg by mouth daily.     LANTUS SOLOSTAR 100 UNIT/ML Solostar Pen Inject into the skin. 31 units (Patient not taking: Reported on 11/30/2023)     latanoprost (XALATAN) 0.005 % ophthalmic solution      lisinopril (ZESTRIL) 20 MG tablet Take 20 mg by mouth daily.     methocarbamol  (ROBAXIN ) 500 MG tablet Take 1 tablet (500 mg total) by mouth 4 (four) times daily. 30 tablet 0   metoCLOPramide  (REGLAN ) 10 MG tablet Take 1 tablet (10 mg total) by mouth 3 (three) times daily with meals for 10 days. 30 tablet 0   metoprolol  tartrate (LOPRESSOR ) 100 MG tablet Take 1 tablet (100 mg total) by mouth once for 1 dose. Take 2 hours prior to procedure. 1 tablet 0   mometasone (NASONEX) 50 MCG/ACT nasal spray Nasonex 50 mcg/actuation spray,non-aerosol     pneumococcal 20-valent conjugate vaccine (PREVNAR 20) 0.5 ML injection PREVNAR 20 0.5 ML SUSY     RELION PEN NEEDLES  31G X 6 MM MISC      TRADJENTA 5 MG TABS tablet TAKE 1 TABLET BY MOUTH ONCE DAILY FOR DIABETES     TRESIBA FLEXTOUCH 100 UNIT/ML SOPN FlexTouch Pen 40 Units daily.     triamcinolone cream (KENALOG) 0.1 %      No current facility-administered medications on file prior to visit.    There are no Patient Instructions on file for this visit. No follow-ups on file.   Larance Ratledge R  Elisabeth, NP

## 2024-04-19 ENCOUNTER — Telehealth (INDEPENDENT_AMBULATORY_CARE_PROVIDER_SITE_OTHER): Payer: Self-pay

## 2024-04-19 NOTE — Telephone Encounter (Signed)
 Spoke with the patient and he is scheduled with Dr. Marea for a RLE angio on 04/25/24 with a 11:00 am arrival time to the [facility]. Pre-procedure instructions were discussed and will be sent to Mychart and mailed.

## 2024-04-25 ENCOUNTER — Encounter
Admission: RE | Disposition: A | Discharge status: Home / Self Care | Payer: Self-pay | Source: Home / Self Care | Attending: Vascular Surgery

## 2024-04-25 ENCOUNTER — Other Ambulatory Visit: Payer: Self-pay

## 2024-04-25 ENCOUNTER — Encounter: Payer: Self-pay | Admitting: Vascular Surgery

## 2024-04-25 ENCOUNTER — Ambulatory Visit
Admission: RE | Admit: 2024-04-25 | Discharge: 2024-04-25 | Disposition: A | Discharge status: Home / Self Care | Attending: Vascular Surgery | Admitting: Vascular Surgery

## 2024-04-25 DIAGNOSIS — Z79899 Other long term (current) drug therapy: Secondary | ICD-10-CM | POA: Diagnosis not present

## 2024-04-25 DIAGNOSIS — Z794 Long term (current) use of insulin: Secondary | ICD-10-CM | POA: Insufficient documentation

## 2024-04-25 DIAGNOSIS — I70261 Atherosclerosis of native arteries of extremities with gangrene, right leg: Secondary | ICD-10-CM | POA: Diagnosis not present

## 2024-04-25 DIAGNOSIS — I70221 Atherosclerosis of native arteries of extremities with rest pain, right leg: Secondary | ICD-10-CM | POA: Diagnosis not present

## 2024-04-25 DIAGNOSIS — L97519 Non-pressure chronic ulcer of other part of right foot with unspecified severity: Secondary | ICD-10-CM | POA: Insufficient documentation

## 2024-04-25 DIAGNOSIS — E11621 Type 2 diabetes mellitus with foot ulcer: Secondary | ICD-10-CM | POA: Insufficient documentation

## 2024-04-25 DIAGNOSIS — I10 Essential (primary) hypertension: Secondary | ICD-10-CM | POA: Diagnosis not present

## 2024-04-25 DIAGNOSIS — I70299 Other atherosclerosis of native arteries of extremities, unspecified extremity: Secondary | ICD-10-CM

## 2024-04-25 DIAGNOSIS — E1152 Type 2 diabetes mellitus with diabetic peripheral angiopathy with gangrene: Secondary | ICD-10-CM | POA: Insufficient documentation

## 2024-04-25 DIAGNOSIS — Z7984 Long term (current) use of oral hypoglycemic drugs: Secondary | ICD-10-CM | POA: Diagnosis not present

## 2024-04-25 HISTORY — PX: LOWER EXTREMITY ANGIOGRAPHY: CATH118251

## 2024-04-25 LAB — CREATININE, SERUM
Creatinine, Ser: 1.38 mg/dL — ABNORMAL HIGH (ref 0.61–1.24)
GFR, Estimated: 57 mL/min — ABNORMAL LOW (ref >60)

## 2024-04-25 LAB — GLUCOSE, CAPILLARY
Glucose-Capillary: 115 mg/dL — ABNORMAL HIGH (ref 70–99)
Glucose-Capillary: 98 mg/dL (ref 70–99)

## 2024-04-25 LAB — BUN: BUN: 33 mg/dL — ABNORMAL HIGH (ref 8–23)

## 2024-04-25 SURGERY — LOWER EXTREMITY ANGIOGRAPHY
Anesthesia: Moderate Sedation | Site: Leg Lower | Laterality: Right

## 2024-04-25 MED ORDER — HYDROMORPHONE HCL 1 MG/ML IJ SOLN: 1.0000 mg | Freq: Once | INTRAMUSCULAR | Status: DC | PRN

## 2024-04-25 MED ORDER — CLOPIDOGREL BISULFATE 75 MG PO TABS: 75.0000 mg | ORAL_TABLET | Freq: Every day | ORAL | Status: DC

## 2024-04-25 MED ORDER — LABETALOL HCL 5 MG/ML IV SOLN
INTRAVENOUS | Status: DC | PRN
  Administered 2024-04-25 (×2): 10 mg via INTRAVENOUS

## 2024-04-25 MED ORDER — LIDOCAINE-EPINEPHRINE (PF) 1 %-1:200000 IJ SOLN
INTRAMUSCULAR | Status: DC | PRN
  Administered 2024-04-25: 10 mL

## 2024-04-25 MED ORDER — MIDAZOLAM HCL 2 MG/ML PO SYRP: 8.0000 mg | ORAL_SOLUTION | Freq: Once | ORAL | Status: DC | PRN

## 2024-04-25 MED ORDER — CEFAZOLIN SODIUM-DEXTROSE 2-4 GM/100ML-% IV SOLN
INTRAVENOUS | Status: AC
  Filled 2024-04-25: qty 100

## 2024-04-25 MED ORDER — FENTANYL CITRATE (PF) 100 MCG/2ML IJ SOLN
INTRAMUSCULAR | Status: AC
  Filled 2024-04-25: qty 2

## 2024-04-25 MED ORDER — CLOPIDOGREL BISULFATE 75 MG PO TABS: 75.0000 mg | ORAL_TABLET | Freq: Every day | ORAL | 11 refills | Status: AC

## 2024-04-25 MED ORDER — HEPARIN (PORCINE) IN NACL 2000-0.9 UNIT/L-% IV SOLN
INTRAVENOUS | Status: DC | PRN
  Administered 2024-04-25: 1000 mL

## 2024-04-25 MED ORDER — ONDANSETRON HCL 4 MG/2ML IJ SOLN: 4.0000 mg | Freq: Four times a day (QID) | INTRAMUSCULAR | Status: DC | PRN

## 2024-04-25 MED ORDER — HEPARIN SODIUM (PORCINE) 1000 UNIT/ML IJ SOLN
INTRAMUSCULAR | Status: AC
  Filled 2024-04-25: qty 10

## 2024-04-25 MED ORDER — FAMOTIDINE 20 MG PO TABS: 40.0000 mg | ORAL_TABLET | Freq: Once | ORAL | Status: DC | PRN

## 2024-04-25 MED ORDER — CEFAZOLIN SODIUM-DEXTROSE 2-4 GM/100ML-% IV SOLN
2.0000 g | INTRAVENOUS | Status: AC
  Administered 2024-04-25: 2 g via INTRAVENOUS

## 2024-04-25 MED ORDER — SODIUM CHLORIDE 0.9 % IV SOLN: 250.0000 mL | INTRAVENOUS | Status: DC | PRN

## 2024-04-25 MED ORDER — LABETALOL HCL 5 MG/ML IV SOLN: 10.0000 mg | INTRAVENOUS | Status: DC | PRN

## 2024-04-25 MED ORDER — METHYLPREDNISOLONE SODIUM SUCC 125 MG IJ SOLR: 125.0000 mg | Freq: Once | INTRAMUSCULAR | Status: DC | PRN

## 2024-04-25 MED ORDER — IODIXANOL 320 MG/ML IV SOLN
INTRAVENOUS | Status: DC | PRN
  Administered 2024-04-25: 65 mL via INTRA_ARTERIAL

## 2024-04-25 MED ORDER — DIPHENHYDRAMINE HCL 50 MG/ML IJ SOLN: 50.0000 mg | Freq: Once | INTRAMUSCULAR | Status: DC | PRN

## 2024-04-25 MED ORDER — HYDRALAZINE HCL 20 MG/ML IJ SOLN: 5.0000 mg | INTRAMUSCULAR | Status: DC | PRN

## 2024-04-25 MED ORDER — FENTANYL CITRATE (PF) 100 MCG/2ML IJ SOLN
INTRAMUSCULAR | Status: DC | PRN
  Administered 2024-04-25: 50 ug via INTRAVENOUS

## 2024-04-25 MED ORDER — MIDAZOLAM HCL 2 MG/2ML IJ SOLN
INTRAMUSCULAR | Status: DC | PRN
  Administered 2024-04-25: 2 mg via INTRAVENOUS

## 2024-04-25 MED ORDER — ACETAMINOPHEN 325 MG PO TABS: 650.0000 mg | ORAL_TABLET | ORAL | Status: DC | PRN

## 2024-04-25 MED ORDER — SODIUM CHLORIDE 0.9% FLUSH: 3.0000 mL | INTRAVENOUS | Status: DC | PRN

## 2024-04-25 MED ORDER — MIDAZOLAM HCL 5 MG/5ML IJ SOLN
INTRAMUSCULAR | Status: AC
  Filled 2024-04-25: qty 5

## 2024-04-25 MED ORDER — SODIUM CHLORIDE 0.9% FLUSH: 3.0000 mL | Freq: Two times a day (BID) | INTRAVENOUS | Status: DC

## 2024-04-25 MED ORDER — HEPARIN SODIUM (PORCINE) 1000 UNIT/ML IJ SOLN
INTRAMUSCULAR | Status: DC | PRN
  Administered 2024-04-25: 5000 [IU] via INTRAVENOUS

## 2024-04-25 MED ORDER — SODIUM CHLORIDE 0.9 % IV SOLN: INTRAVENOUS | Status: DC

## 2024-04-25 MED ORDER — LABETALOL HCL 5 MG/ML IV SOLN
INTRAVENOUS | Status: AC
  Filled 2024-04-25: qty 4

## 2024-04-25 SURGICAL SUPPLY — 26 items
BALLOON ARMADA 2.0X80X150 (BALLOONS) IMPLANT
BALLOON ARMADA 2.5X200X150X (BALLOONS) IMPLANT
BALLOON JADE .014 2.5 X 20 (BALLOONS) IMPLANT
BALLOON JADE .014 3.5 X 20 (BALLOONS) IMPLANT
BALLOON LUTONIX 018 5X150X130 (BALLOONS) IMPLANT
BALLOON ULTRVS 018 2.5X150X150 (BALLOONS) IMPLANT
CATH CXI SUPP ANG 4FR 135 (CATHETERS) IMPLANT
CATH PIG 70CM (CATHETERS) IMPLANT
CATH SEEKER .018X150 (CATHETERS) IMPLANT
CATH VERT 5X100 (CATHETERS) IMPLANT
COVER PROBE ULTRASOUND 5X96 (MISCELLANEOUS) IMPLANT
DEVICE PRESTO INFLATION (MISCELLANEOUS) IMPLANT
DEVICE VASC CLSR CELT ART 6 (Vascular Products) IMPLANT
GLIDEWIRE ADV .035X260CM (WIRE) IMPLANT
GUIDEWIRE PFTE-COATED .018X300 (WIRE) IMPLANT
PACK ANGIOGRAPHY (CUSTOM PROCEDURE TRAY) ×1 IMPLANT
SHEATH ANL2 6FRX45 HC (SHEATH) IMPLANT
SHEATH BRITE TIP 5FRX11 (SHEATH) IMPLANT
SHEATH BRITE TIP 6FRX11 (SHEATH) IMPLANT
STENT ESPRIT BTK 2.5X38 SCAFF (Permanent Stent) IMPLANT
STENT ESPRIT BTK 3.5X28 SCAFF (Permanent Stent) IMPLANT
STENT VIABAHN 6X150X120 (Permanent Stent) IMPLANT
SYR MEDRAD MARK 7 150ML (SYRINGE) IMPLANT
TUBING CONTRAST HIGH PRESS 72 (TUBING) IMPLANT
WIRE COMMAND ST STR 014 300 (WIRE) IMPLANT
WIRE J 3MM .035X145CM (WIRE) IMPLANT

## 2024-04-25 NOTE — Op Note (Signed)
 [facility]  Percutaneous Study/Intervention Procedural Note   Date of Surgery: 04/25/2024  Surgeon(s):Orlanda Lemmerman    Assistants:none  Pre-operative Diagnosis: PAD with claudication and early rest pain right lower extremity  Post-operative diagnosis:  Same  Procedure(s) Performed:             1.  Ultrasound guidance for vascular access left femoral artery             2.  Catheter placement into right common femoral artery from left femoral approach             3.  Aortogram and selective right lower extremity angiogram             4.  Percutaneous transluminal angioplasty of right peroneal artery, tibioperoneal trunk, and distal popliteal artery with 3 mm diameter angioplasty balloon             5.  Percutaneous transluminal angioplasty of right posterior tibial artery with 2 and 2.5 mm diameter angioplasty balloons  6.  Stent placement to the right posterior tibial artery with 2.5 mm diameter by 38 mm length Esprit scaffolding system             7.  Stent placement to the right distal popliteal artery and tibioperoneal trunk with 3.5 mm diameter by 28 mm length Esprit scaffolding system  8.  Percutaneous transluminal angioplasty of right SFA with 5 mm diameter Lutonix drug-coated angioplasty  9.  Stent placement to the right SFA with 6 mm diameter by 15 cm length Viabahn stent 10.  Celt closure device left femoral artery  EBL: 10 cc  Contrast: 65 cc  Fluoro Time: 13.4 minutes  Moderate Conscious Sedation Time: approximately 93 minutes using 2 mg of Versed  and 50 mcg of Fentanyl               Indications:  Patient is a 63 y.o.male with dusky changes to toes of his right foot and significant claudication symptoms now with early rest pain. The patient has noninvasive study showing monophasic and reduced flow bilaterally. The patient is brought in for angiography for further evaluation and potential treatment.  Due to the limb threatening nature of the situation,  angiogram was performed for attempted limb salvage. The patient is aware that if the procedure fails, amputation would be expected.  The patient also understands that even with successful revascularization, amputation may still be required due to the severity of the situation.  Risks and benefits are discussed and informed consent is obtained.   Procedure:  The patient was identified and appropriate procedural time out was performed.  The patient was then placed supine on the table and prepped and draped in the usual sterile fashion. Moderate conscious sedation was administered during a face to face encounter with the patient throughout the procedure with my supervision of the RN administering medicines and monitoring the patient's vital signs, pulse oximetry, telemetry and mental status throughout from the start of the procedure until the patient was taken to the recovery room. Ultrasound was used to evaluate the left common femoral artery.  It was patent .  A digital ultrasound image was acquired.  A Seldinger needle was used to access the left common femoral artery under direct ultrasound guidance and a permanent image was performed.  A 0.035 J wire was advanced without resistance and a 5Fr sheath was placed.  Pigtail catheter was placed into the aorta and an AP aortogram was performed. This demonstrated normal renal arteries and normal  aorta and iliac segments without significant stenosis. I then crossed the aortic bifurcation and advanced to the right femoral head. Selective right lower extremity angiogram was then performed. This demonstrated calcific common femoral and proximal superficial femoral arteries without obvious stenosis.  The mid to distal superficial femoral artery had moderate disease in the 60 to 70% range over about a 10 to 12 cm span.  The popliteal artery normalized down to the distal segment where there was a napkin ringlike lesion at the origin of the anterior tibial artery of greater  than 85% stenosis.  The tibioperoneal trunk was then patent initially but the distal tibioperoneal trunk and the proximal portions of both peroneal and the posterior tibial arteries were heavily diseased with greater than 80% stenosis.  These were the only 2 runoff vessels distally as the anterior tibial artery was occluded without distal reconstitution.  Both the posterior tibial and peroneal arteries reconstituted after the proximal disease and were continuous distally. It was felt that it was in the patient's best interest to proceed with intervention after these images to avoid a second procedure and a larger amount of contrast and fluoroscopy based off of the findings from the initial angiogram. The patient was systemically heparinized and a 6 [place] Ansell sheath was then placed over the [employer] wire. I then used a Kumpe catheter and the advantage wire to navigate through the SFA disease and down into the distal popliteal artery were exchanged for a 0.018 advantage wire and a CXI catheter.  I first across the distal popliteal and tibioperoneal trunk stenosis and initially got down into the peroneal artery and crossed that proximal stenosis parking the wire distally.  A 3 mm diameter by 15 cm length angioplasty balloon was then inflated to 10 atm for 1 minute in the right peroneal artery, tibioperoneal trunk, and most distal popliteal artery.  Completion imaging showed the peroneal artery to now be patent with less than 30% residual stenosis.  There remained a high-grade stenosis at the origin of the anterior tibial artery in the most distal popliteal artery and tibioperoneal trunk.  I then used the CXI catheter and the 0.018 advantage wire and navigated down across the posterior tibial artery occlusion without difficulty.  We eventually exchanged for a 0.014 wire.  2.5 mm diameter angioplasty balloon was used initially but would not cross the heavily diseased proximal posterior tibial artery was  inflated in the tibioperoneal trunk and the origin of the posterior tibial artery.  I then used a 2 mm diameter 0.014 balloon and was able to cross the lesion in the posterior tibial artery inflating this to 14 atm for 1 minute.  Completion imaging showed greater than 50% residual stenosis in the posterior tibial artery and the known lesion at the origin of the anterior tibial artery.  I elected to stent these areas.  A 2.5 mm diameter by 38 mm length Esprit scaffolding system was selected and deployed in the posterior tibial artery and postdilated with a 2.5 mm balloon.  A 3.5 mm diameter by 28 mm length Esprit scaffolding system was used in the proximal tibioperoneal trunk and distal popliteal artery.  This was postdilated with a 3.5 mm diameter angioplasty balloon completion imaging following stent placement in these 2 locations showed less than 10% residual stenosis in the distal popliteal artery and tibioperoneal trunk and less than 15% residual stenosis in the posterior tibial artery.  I then turned my attention to the SFA.  A 5 mm diameter by 15  cm length Lutonix drug-coated angioplasty balloon was inflated to 12 atm for 1 minute in the right SFA.  Completion imaging showed suboptimal result with greater than 50% residual stenosis and a 6 mm diameter by 15 cm length Viabahn stent was selected and deployed and then postdilated with a 5 mm balloon with excellent angiographic completion result and less than 10% residual stenosis in the right SFA. I elected to terminate the procedure. The sheath was removed and Celt closure device was deployed in the left femoral artery with excellent hemostatic result. The patient was taken to the recovery room in stable condition having tolerated the procedure well.  Findings:               Aortogram:  This demonstrated normal renal arteries and normal aorta and iliac segments without significant stenosis.             Right Lower Extremity:  This demonstrated calcific  common femoral and proximal superficial femoral arteries without obvious stenosis.  The mid to distal superficial femoral artery had moderate disease in the 60 to 70% range over about a 10 to 12 cm span.  The popliteal artery normalized down to the distal segment where there was a napkin ringlike lesion at the origin of the anterior tibial artery of greater than 85% stenosis.  The tibioperoneal trunk was then patent initially but the distal tibioperoneal trunk and the proximal portions of both peroneal and the posterior tibial arteries were heavily diseased with greater than 80% stenosis.  These were the only 2 runoff vessels distally as the anterior tibial artery was occluded without distal reconstitution.  Both the posterior tibial and peroneal arteries reconstituted after the proximal disease and were continuous distally.   Disposition: Patient was taken to the recovery room in stable condition having tolerated the procedure well.  Complications: None  Selinda Gu 04/25/2024 3:34 PM   This note was created with Dragon Medical transcription system. Any errors in dictation are purely unintentional.

## 2024-04-25 NOTE — Interval H&P Note (Signed)
 History and Physical Interval Note:  04/25/2024 11:09 AM  Marcus [name]  has presented today for surgery, with the diagnosis of RLE Angio   ASO w ulceration.  The various methods of treatment have been discussed with the patient and family. After consideration of risks, benefits and other options for treatment, the patient has consented to  Procedure(s): Lower Extremity Angiography (Right) as a surgical intervention.  The patient's history has been reviewed, patient examined, no change in status, stable for surgery.  I have reviewed the patient's chart and labs.  Questions were answered to the patient's satisfaction.     [name]

## 2024-04-25 NOTE — OR Nursing (Signed)
 Dr dew notified that pt hypertensive even after void. Pt didn't take am antihypertensives. Instructed to have patient take home meds which are in his pocket and discharge.

## 2024-04-26 ENCOUNTER — Encounter: Payer: Self-pay | Admitting: Vascular Surgery

## 2024-04-27 ENCOUNTER — Encounter: Payer: Self-pay | Admitting: Vascular Surgery

## 2024-05-05 ENCOUNTER — Encounter: Payer: Self-pay | Admitting: Podiatry

## 2024-05-05 ENCOUNTER — Ambulatory Visit (INDEPENDENT_AMBULATORY_CARE_PROVIDER_SITE_OTHER): Admitting: Podiatry

## 2024-05-05 DIAGNOSIS — I96 Gangrene, not elsewhere classified: Secondary | ICD-10-CM

## 2024-05-05 DIAGNOSIS — I999 Unspecified disorder of circulatory system: Secondary | ICD-10-CM | POA: Diagnosis not present

## 2024-05-05 NOTE — Progress Notes (Signed)
 Subjective:  Patient ID: Marcus [name], male    DOB: 02-May-1961,  MRN: [id number]  Chief Complaint  Patient presents with   Vascular abnormality    Pt stated that he has been better     63 y.o. male presents with the above complaint.  Patient presents with right fourth digit dusky changes.  Patient states this came on no one to get it evaluated he is a diabetic.  He does not have any history of peripheral vascular disease.  He states that he got little bit darker and really more painful denies any other acute issues.  Wanted to discuss treatment options for this.   Review of Systems: Negative except as noted in the HPI. Denies N/V/F/Ch.  Past Medical History:  Diagnosis Date   Anxiety    Diabetes mellitus without complication (HCC)    ED (erectile dysfunction)    Hyperlipidemia    Hypertension     Current Outpatient Medications:    ACCU-CHEK AVIVA PLUS test strip, USE STRIP TO CHECK GLUCOSE THREE TIMES DAILY, Disp: , Rfl:    ACCU-CHEK FASTCLIX LANCETS MISC, USE TO CHECK BLOOD SUGAR THREE TIMES DAILY, Disp: , Rfl:    amLODipine (NORVASC) 5 MG tablet, Take 5 mg by mouth daily. for high blood pressure, Disp: , Rfl:    aspirin  EC 81 MG tablet, Take by mouth., Disp: , Rfl:    atorvastatin (LIPITOR) 40 MG tablet, Take 40 mg by mouth daily at 6 PM. , Disp: , Rfl:    Blood Glucose Monitoring Suppl (ACCU-CHEK AVIVA PLUS) w/Device KIT, ACCU-CHEK AVIVA PLUS w/Device KIT, Disp: , Rfl:    clopidogrel  (PLAVIX ) 75 MG tablet, Take 1 tablet (75 mg total) by mouth daily., Disp: 30 tablet, Rfl: 11   cyclobenzaprine (FLEXERIL) 5 MG tablet, CYCLOBENZAPRINE HCL 5 MG TABS (Patient not taking: Reported on 04/25/2024), Disp: , Rfl:    diclofenac (VOLTAREN) 75 MG EC tablet, DICLOFENAC SODIUM 75 MG TBEC, Disp: , Rfl:    furosemide (LASIX) 20 MG tablet, TAKE 1 TABLET BY MOUTH IN THE MORNING FOR HIGH BLOOD PRESSURE. TAKE IN PLACE OF HCTZ., Disp: , Rfl:    gabapentin (NEURONTIN) 300 MG capsule, Take 300 mg by  mouth as needed. , Disp: , Rfl:    JARDIANCE 25 MG TABS tablet, Take 25 mg by mouth daily., Disp: , Rfl:    LANTUS SOLOSTAR 100 UNIT/ML Solostar Pen, Inject into the skin. 31 units, Disp: , Rfl:    latanoprost (XALATAN) 0.005 % ophthalmic solution, , Disp: , Rfl:    lisinopril (ZESTRIL) 20 MG tablet, Take 20 mg by mouth daily., Disp: , Rfl:    methocarbamol  (ROBAXIN ) 500 MG tablet, Take 1 tablet (500 mg total) by mouth 4 (four) times daily., Disp: 30 tablet, Rfl: 0   metoCLOPramide  (REGLAN ) 10 MG tablet, Take 1 tablet (10 mg total) by mouth 3 (three) times daily with meals for 10 days., Disp: 30 tablet, Rfl: 0   metoprolol  tartrate (LOPRESSOR ) 100 MG tablet, Take 1 tablet (100 mg total) by mouth once for 1 dose. Take 2 hours prior to procedure., Disp: 1 tablet, Rfl: 0   mometasone (NASONEX) 50 MCG/ACT nasal spray, Nasonex 50 mcg/actuation spray,non-aerosol (Patient not taking: Reported on 04/25/2024), Disp: , Rfl:    pneumococcal 20-valent conjugate vaccine (PREVNAR 20) 0.5 ML injection, PREVNAR 20 0.5 ML SUSY, Disp: , Rfl:    RELION PEN NEEDLES 31G X 6 MM MISC, , Disp: , Rfl:    TRADJENTA 5 MG TABS tablet, TAKE  1 TABLET BY MOUTH ONCE DAILY FOR DIABETES, Disp: , Rfl:    TRESIBA FLEXTOUCH 100 UNIT/ML SOPN FlexTouch Pen, 40 Units daily., Disp: , Rfl:    triamcinolone cream (KENALOG) 0.1 %, , Disp: , Rfl:   Social History   Tobacco Use  Smoking Status Former  Smokeless Tobacco Never    Allergies  Allergen Reactions   Hydrochlorothiazide     Other Reaction(s): has gout   Objective:  There were no vitals filed for this visit. There is no height or weight on file to calculate BMI. Constitutional Well developed. Well nourished.  Vascular Dorsalis pedis pulses palpable bilaterally. Posterior tibial pulses palpable bilaterally. Capillary refill normal to all digits.  No cyanosis or clubbing noted. Pedal hair growth normal.  Neurologic Normal speech. Oriented to person, place, and  time. Epicritic sensation to light touch grossly present bilaterally.  Dermatologic Right fourth digit dusky change noted to the distal fourth toe.  No open wounds or lesion noted.  Appears to be superficial gangrene in nature.  Dry stable  Orthopedic: Normal joint ROM without pain or crepitus bilaterally. No visible deformities. No bony tenderness.   Radiographs: None Assessment:   1. Vascular abnormality   2. Toe gangrene (HCC)     Plan:  Patient was evaluated and treated and all questions answered.  Right fourth digit dusky/beginning gangrenous changes noted~improving - All questions and concerns were discussed with the patient extensive detail - Had angiogram done which shows 2 vessels to the flow via the peroneal and the PT.  The AT is Occluded - I discussed with him if he regresses go to the emergency room right away he states understanding.  For now the gangrene is improving.  I will see him back for the last time in 6 weeks.  At this time I will no longer concerned about gangrenous toe as the color is improving.  No follow-ups on file.

## 2024-05-17 ENCOUNTER — Other Ambulatory Visit (INDEPENDENT_AMBULATORY_CARE_PROVIDER_SITE_OTHER): Payer: Self-pay | Admitting: Vascular Surgery

## 2024-05-17 DIAGNOSIS — Z9889 Other specified postprocedural states: Secondary | ICD-10-CM

## 2024-05-23 ENCOUNTER — Encounter (INDEPENDENT_AMBULATORY_CARE_PROVIDER_SITE_OTHER): Payer: Self-pay | Admitting: Nurse Practitioner

## 2024-05-23 ENCOUNTER — Encounter (INDEPENDENT_AMBULATORY_CARE_PROVIDER_SITE_OTHER): Payer: Self-pay | Admitting: Vascular Surgery

## 2024-05-23 ENCOUNTER — Ambulatory Visit (INDEPENDENT_AMBULATORY_CARE_PROVIDER_SITE_OTHER)

## 2024-05-23 ENCOUNTER — Ambulatory Visit (INDEPENDENT_AMBULATORY_CARE_PROVIDER_SITE_OTHER): Admitting: Nurse Practitioner

## 2024-05-23 VITALS — BP 149/84 | HR 91 | Wt 205.2 lb

## 2024-05-23 DIAGNOSIS — Z9889 Other specified postprocedural states: Secondary | ICD-10-CM

## 2024-05-23 DIAGNOSIS — Z794 Long term (current) use of insulin: Secondary | ICD-10-CM

## 2024-05-23 DIAGNOSIS — E1142 Type 2 diabetes mellitus with diabetic polyneuropathy: Secondary | ICD-10-CM | POA: Diagnosis not present

## 2024-05-23 DIAGNOSIS — I739 Peripheral vascular disease, unspecified: Secondary | ICD-10-CM | POA: Diagnosis not present

## 2024-05-23 DIAGNOSIS — I10 Essential (primary) hypertension: Secondary | ICD-10-CM | POA: Diagnosis not present

## 2024-05-23 DIAGNOSIS — E1151 Type 2 diabetes mellitus with diabetic peripheral angiopathy without gangrene: Secondary | ICD-10-CM | POA: Diagnosis not present

## 2024-05-23 NOTE — Progress Notes (Signed)
 Subjective:    Patient ID: Marcus [name], male    DOB: [birth year], 63 y.o.   MRN: [id number] Chief Complaint  Patient presents with   Follow-up    The patient returns to the office for followup and review status post angiogram with intervention on 04/25/2024.   Procedure: Procedure(s) Performed:             1.  Ultrasound guidance for vascular access left femoral artery             2.  Catheter placement into right common femoral artery from left femoral approach             3.  Aortogram and selective right lower extremity angiogram             4.  Percutaneous transluminal angioplasty of right peroneal artery, tibioperoneal trunk, and distal popliteal artery with 3 mm diameter angioplasty balloon             5.  Percutaneous transluminal angioplasty of right posterior tibial artery with 2 and 2.5 mm diameter angioplasty balloons             6.  Stent placement to the right posterior tibial artery with 2.5 mm diameter by 38 mm length Esprit scaffolding system             7.  Stent placement to the right distal popliteal artery and tibioperoneal trunk with 3.5 mm diameter by 28 mm length Esprit scaffolding system             8.  Percutaneous transluminal angioplasty of right SFA with 5 mm diameter Lutonix drug-coated angioplasty             9.  Stent placement to the right SFA with 6 mm diameter by 15 cm length Viabahn stent 10.  Celt closure device left femoral artery   The patient notes improvement in the lower extremity symptoms. No interval shortening of the patient's claudication distance or rest pain symptoms. No new ulcers or wounds have occurred since the last visit.  Discoloration on the patient's toe has improved  There have been no significant changes to the patient's overall health care.  No documented history of amaurosis fugax or recent TIA symptoms. There are no recent neurological changes noted. No documented history of DVT, PE or superficial thrombophlebitis. The  patient denies recent episodes of angina or shortness of breath.   ABI's Rt=1.05 and Lt=1.08  (previous ABI's Rt=0.90 and Lt=[place]) Duplex US  of the bilateral lower extremity arterial system shows biphasic waveforms with good toe waveforms bilaterally    Review of Systems  Skin:  Positive for color change.       Objective:   Physical Exam Vitals reviewed.  HENT:     Head: Normocephalic.  Cardiovascular:     Rate and Rhythm: Normal rate.     Pulses:          Dorsalis pedis pulses are detected w/ Doppler on the right side and detected w/ Doppler on the left side.       Posterior tibial pulses are detected w/ Doppler on the right side and detected w/ Doppler on the left side.  Pulmonary:     Effort: Pulmonary effort is normal.  Skin:    General: Skin is warm and dry.  Neurological:     Mental Status: He is alert and oriented to person, place, and time.  Psychiatric:        Mood and Affect: Mood  normal.        Thought Content: Thought content normal.        Judgment: Judgment normal.     BP (!) 149/84   Pulse 91   Wt 205 lb 4 oz (93.1 kg)   BMI 30.31 kg/m   Past Medical History:  Diagnosis Date   Anxiety    Diabetes mellitus without complication (HCC)    ED (erectile dysfunction)    Hyperlipidemia    Hypertension     Social History   Socioeconomic History   Marital status: Single    Spouse name: Not on file   Number of children: Not on file   Years of education: Not on file   Highest education level: Not on file  Occupational History   Not on file  Tobacco Use   Smoking status: Former   Smokeless tobacco: Never  Substance and Sexual Activity   Alcohol use: Yes   Drug use: No   Sexual activity: Yes    Birth control/protection: None  Other Topics Concern   Not on file  Social History Narrative   Not on file   Social Drivers of Health   Financial Resource Strain: Not on file  Food Insecurity: Not on file  Transportation Needs: Not on file  Physical  Activity: Not on file  Stress: Not on file  Social Connections: Not on file  Intimate Partner Violence: Not on file    Past Surgical History:  Procedure Laterality Date   LOWER EXTREMITY ANGIOGRAPHY Right 04/25/2024   Procedure: Lower Extremity Angiography;  Surgeon: Marea Selinda RAMAN, MD;  Location: [facility];  Service: Cardiovascular;  Laterality: Right;   NO PAST SURGERIES      Family History  Problem Relation Age of Onset   Cancer Father        Lung   Cancer Mother        spinal   Prostate cancer Neg Hx    Bladder Cancer Neg Hx    Kidney cancer Neg Hx     Allergies  Allergen Reactions   Hydrochlorothiazide     Other Reaction(s): has gout       Latest Ref Rng & Units 11/25/2023   11:50 AM 01/12/2020    9:35 AM 05/11/2012   11:37 AM  CBC  WBC 4.0 - 10.5 K/uL 6.6  5.8  7.3   Hemoglobin 13.0 - 17.0 g/dL 82.9  82.4  82.0   Hematocrit 39.0 - 52.0 % 50.7  51.8  52.4   Platelets 150 - 400 K/uL 256  248  300       CMP     Component Value Date/Time   NA 133 (L) 11/25/2023 1150   NA 132 (L) 05/11/2012 1137   K 3.4 (L) 11/25/2023 1150   K 3.7 05/11/2012 1137   CL 96 (L) 11/25/2023 1150   CL 98 05/11/2012 1137   CO2 22 11/25/2023 1150   CO2 26 05/11/2012 1137   GLUCOSE 200 (H) 11/25/2023 1150   GLUCOSE 361 (H) 05/11/2012 1137   BUN 33 (H) 04/25/2024 1258   BUN 25 (H) 05/11/2012 1137   CREATININE 1.38 (H) 04/25/2024 1258   CREATININE 1.36 (H) 05/11/2012 1137   CALCIUM 9.0 11/25/2023 1150   CALCIUM 9.7 05/11/2012 1137   PROT 7.8 11/25/2023 1150   ALBUMIN 3.5 11/25/2023 1150   AST 34 11/25/2023 1150   ALT 26 11/25/2023 1150   ALKPHOS 38 11/25/2023 1150   BILITOT 0.8 11/25/2023 1150  GFRNONAA 57 (L) 04/25/2024 1258   GFRNONAA 59 (L) 05/11/2012 1137     VAS US  ABI WITH/WO TBI Result Date: 04/04/2024  LOWER EXTREMITY DOPPLER STUDY Patient Name:  Marcus [name]  Date of Exam:   04/04/2024 Medical Rec #: [id number]        Accession #:    [id number] Date of  Birth: [birth year]       Patient Gender: M Patient Age:   63 years Exam Location:  [facility] Procedure:      VAS US  ABI WITH/WO TBI Referring Phys: KEVIN PATEL --------------------------------------------------------------------------------  Indications: Peripheral artery disease. High Risk Factors: Hypertension, hyperlipidemia, Diabetes. Other Factors: Discoloration of right fourth toe.  Performing Technologist: Donnice Charnley RVT  Examination Guidelines: A complete evaluation includes at minimum, Doppler waveform signals and systolic blood pressure reading at the level of bilateral brachial, anterior tibial, and posterior tibial arteries, when vessel segments are accessible. Bilateral testing is considered an integral part of a complete examination. Photoelectric Plethysmograph (PPG) waveforms and toe systolic pressure readings are included as required and additional duplex testing as needed. Limited examinations for reoccurring indications may be performed as noted.  ABI Findings: +---------+------------------+-----+----------+-------+ Right    Rt Pressure (mmHg)IndexWaveform  Comment +---------+------------------+-----+----------+-------+ Brachial 196                                      +---------+------------------+-----+----------+-------+ PTA      176               0.90 monophasic        +---------+------------------+-----+----------+-------+ DP       154               0.79 monophasic        +---------+------------------+-----+----------+-------+ Great Toe84                0.43                   +---------+------------------+-----+----------+-------+ +---------+------------------+-----+----------+-------+ Left     Lt Pressure (mmHg)IndexWaveform  Comment +---------+------------------+-----+----------+-------+ Brachial 189                                      +---------+------------------+-----+----------+-------+ ATA      255               1.30  monophasic        +---------+------------------+-----+----------+-------+ PTA      255               1.30 monophasic        +---------+------------------+-----+----------+-------+ Great Toe59                0.30                   +---------+------------------+-----+----------+-------+ +-------+----------------+-----------+------------+------------+ ABI/TBIToday's ABI     Today's TBIPrevious ABIPrevious TBI +-------+----------------+-----------+------------+------------+ Right  0.90            0.43       1.08        0.70         +-------+----------------+-----------+------------+------------+ Left   Non compressible0.30       1.09        0.73         +-------+----------------+-----------+------------+------------+  Arterial wall calcification precludes accurate ankle pressures and ABIs. PPG tracings display appropriate  pulsatility. Right ABIs appear decreased compared to prior study on 01/26/2023. Bilateral TBIs appear decreased compared to prior study on 01/26/2023.  Summary: Right: Resting right ankle-brachial index indicates mild right lower extremity arterial disease. Doppler waveforms and TBIs suggest significant arterial occlusive disease. Left: Resting left ankle-brachial index indicates noncompressible left lower extremity arteries. Doppler waveforms and TBIs suggest significant arterial occlusive disease.  *See table(s) above for measurements and observations.  Electronically signed by Selinda Gu MD on 04/04/2024 at 1:02:09 PM.    Final        Assessment & Plan:   1. PAD (peripheral artery disease) (HCC) (Primary) Recommend:  The patient is status post successful angiogram with intervention.  The patient reports that the claudication symptoms and leg pain has improved.   The patient denies lifestyle limiting changes at this point in time.  No further invasive studies, angiography or surgery at this time. The patient should continue walking and begin a more formal  exercise program.  The patient should continue antiplatelet therapy and aggressive treatment of the lipid abnormalities  Continued surveillance is indicated as atherosclerosis is likely to progress with time.    Patient should undergo noninvasive studies as ordered. The patient will follow up with me to review the studies.   2. Essential hypertension Continue antihypertensive medications as already ordered, these medications have been reviewed and there are no changes at this time.  3. Type 2 diabetes mellitus with diabetic peripheral angiopathy without gangrene, with long-term current use of insulin (HCC) Continue hypoglycemic medications as already ordered, these medications have been reviewed and there are no changes at this time.  Hgb A1C to be monitored as already arranged by primary service  4. Diabetic polyneuropathy associated with type 2 diabetes mellitus (HCC) The patient does have ongoing discomfort in his feet and based on the description of symptoms it is related to his neuropathic issues.   Current Outpatient Medications on File Prior to Visit  Medication Sig Dispense Refill   ACCU-CHEK AVIVA PLUS test strip USE STRIP TO CHECK GLUCOSE THREE TIMES DAILY     ACCU-CHEK FASTCLIX LANCETS MISC USE TO CHECK BLOOD SUGAR THREE TIMES DAILY     amLODipine (NORVASC) 5 MG tablet Take 5 mg by mouth daily. for high blood pressure     aspirin  EC 81 MG tablet Take by mouth.     atorvastatin (LIPITOR) 40 MG tablet Take 40 mg by mouth daily at 6 PM.      Blood Glucose Monitoring Suppl (ACCU-CHEK AVIVA PLUS) w/Device KIT ACCU-CHEK AVIVA PLUS w/Device KIT     clopidogrel  (PLAVIX ) 75 MG tablet Take 1 tablet (75 mg total) by mouth daily. 30 tablet 11   diclofenac (VOLTAREN) 75 MG EC tablet DICLOFENAC SODIUM 75 MG TBEC     furosemide (LASIX) 20 MG tablet TAKE 1 TABLET BY MOUTH IN THE MORNING FOR HIGH BLOOD PRESSURE. TAKE IN PLACE OF HCTZ.     gabapentin (NEURONTIN) 300 MG capsule Take 300 mg by  mouth as needed.      JARDIANCE 25 MG TABS tablet Take 25 mg by mouth daily.     LANTUS SOLOSTAR 100 UNIT/ML Solostar Pen Inject into the skin. 31 units     latanoprost (XALATAN) 0.005 % ophthalmic solution      lisinopril (ZESTRIL) 20 MG tablet Take 20 mg by mouth daily.     methocarbamol  (ROBAXIN ) 500 MG tablet Take 1 tablet (500 mg total) by mouth 4 (four) times daily. 30 tablet 0   metoCLOPramide  (  REGLAN ) 10 MG tablet Take 1 tablet (10 mg total) by mouth 3 (three) times daily with meals for 10 days. 30 tablet 0   metoprolol  tartrate (LOPRESSOR ) 100 MG tablet Take 1 tablet (100 mg total) by mouth once for 1 dose. Take 2 hours prior to procedure. 1 tablet 0   pneumococcal 20-valent conjugate vaccine (PREVNAR 20) 0.5 ML injection PREVNAR 20 0.5 ML SUSY     RELION PEN NEEDLES 31G X 6 MM MISC      TRADJENTA 5 MG TABS tablet TAKE 1 TABLET BY MOUTH ONCE DAILY FOR DIABETES     TRESIBA FLEXTOUCH 100 UNIT/ML SOPN FlexTouch Pen 40 Units daily.     triamcinolone cream (KENALOG) 0.1 %      cyclobenzaprine (FLEXERIL) 5 MG tablet CYCLOBENZAPRINE HCL 5 MG TABS (Patient not taking: Reported on 05/23/2024)     mometasone (NASONEX) 50 MCG/ACT nasal spray Nasonex 50 mcg/actuation spray,non-aerosol (Patient not taking: Reported on 05/23/2024)     No current facility-administered medications on file prior to visit.    There are no Patient Instructions on file for this visit. No follow-ups on file.   Tag Wurtz E Zakarie Sturdivant, NP

## 2024-05-26 NOTE — Telephone Encounter (Signed)
 I thought it may be best if you respond, given his sensitivity about this matter

## 2024-05-31 LAB — VAS US ABI WITH/WO TBI
Left ABI: 1.08
Right ABI: 1.05

## 2024-06-16 ENCOUNTER — Ambulatory Visit (INDEPENDENT_AMBULATORY_CARE_PROVIDER_SITE_OTHER): Admitting: Podiatry

## 2024-06-16 DIAGNOSIS — I96 Gangrene, not elsewhere classified: Secondary | ICD-10-CM

## 2024-06-16 DIAGNOSIS — I999 Unspecified disorder of circulatory system: Secondary | ICD-10-CM

## 2024-06-16 NOTE — Progress Notes (Signed)
 Subjective:  Patient ID: Marcus [name], male    DOB: [birth year],  MRN: [id number]  Chief Complaint  Patient presents with   Routine Post Op    6 wk f/u right foot. Complains of a callous on the right foot 4th toe    63 y.o. male presents with the above complaint.  Presents for follow-up of right fourth digit gangrene.  He states he is doing better has slowly gotten better.  Denies any other acute complaints.   Review of Systems: Negative except as noted in the HPI. Denies N/V/F/Ch.  Past Medical History:  Diagnosis Date   Anxiety    Diabetes mellitus without complication (HCC)    ED (erectile dysfunction)    Hyperlipidemia    Hypertension     Current Outpatient Medications:    ACCU-CHEK AVIVA PLUS test strip, USE STRIP TO CHECK GLUCOSE THREE TIMES DAILY, Disp: , Rfl:    ACCU-CHEK FASTCLIX LANCETS MISC, USE TO CHECK BLOOD SUGAR THREE TIMES DAILY, Disp: , Rfl:    amLODipine (NORVASC) 5 MG tablet, Take 5 mg by mouth daily. for high blood pressure, Disp: , Rfl:    aspirin  EC 81 MG tablet, Take by mouth., Disp: , Rfl:    atorvastatin (LIPITOR) 40 MG tablet, Take 40 mg by mouth daily at 6 PM. , Disp: , Rfl:    Blood Glucose Monitoring Suppl (ACCU-CHEK AVIVA PLUS) w/Device KIT, ACCU-CHEK AVIVA PLUS w/Device KIT, Disp: , Rfl:    clopidogrel  (PLAVIX ) 75 MG tablet, Take 1 tablet (75 mg total) by mouth daily., Disp: 30 tablet, Rfl: 11   cyclobenzaprine (FLEXERIL) 5 MG tablet, CYCLOBENZAPRINE HCL 5 MG TABS (Patient not taking: Reported on 05/23/2024), Disp: , Rfl:    diclofenac (VOLTAREN) 75 MG EC tablet, DICLOFENAC SODIUM 75 MG TBEC, Disp: , Rfl:    furosemide (LASIX) 20 MG tablet, TAKE 1 TABLET BY MOUTH IN THE MORNING FOR HIGH BLOOD PRESSURE. TAKE IN PLACE OF HCTZ., Disp: , Rfl:    gabapentin (NEURONTIN) 300 MG capsule, Take 300 mg by mouth as needed. , Disp: , Rfl:    JARDIANCE 25 MG TABS tablet, Take 25 mg by mouth daily., Disp: , Rfl:    LANTUS SOLOSTAR 100 UNIT/ML Solostar Pen,  Inject into the skin. 31 units, Disp: , Rfl:    latanoprost (XALATAN) 0.005 % ophthalmic solution, , Disp: , Rfl:    lisinopril (ZESTRIL) 20 MG tablet, Take 20 mg by mouth daily., Disp: , Rfl:    methocarbamol  (ROBAXIN ) 500 MG tablet, Take 1 tablet (500 mg total) by mouth 4 (four) times daily., Disp: 30 tablet, Rfl: 0   metoCLOPramide  (REGLAN ) 10 MG tablet, Take 1 tablet (10 mg total) by mouth 3 (three) times daily with meals for 10 days., Disp: 30 tablet, Rfl: 0   metoprolol  tartrate (LOPRESSOR ) 100 MG tablet, Take 1 tablet (100 mg total) by mouth once for 1 dose. Take 2 hours prior to procedure., Disp: 1 tablet, Rfl: 0   mometasone (NASONEX) 50 MCG/ACT nasal spray, Nasonex 50 mcg/actuation spray,non-aerosol (Patient not taking: Reported on 05/23/2024), Disp: , Rfl:    pneumococcal 20-valent conjugate vaccine (PREVNAR 20) 0.5 ML injection, PREVNAR 20 0.5 ML SUSY, Disp: , Rfl:    RELION PEN NEEDLES 31G X 6 MM MISC, , Disp: , Rfl:    TRADJENTA 5 MG TABS tablet, TAKE 1 TABLET BY MOUTH ONCE DAILY FOR DIABETES, Disp: , Rfl:    TRESIBA FLEXTOUCH 100 UNIT/ML SOPN FlexTouch Pen, 40 Units daily., Disp: , Rfl:  triamcinolone cream (KENALOG) 0.1 %, , Disp: , Rfl:   Social History   Tobacco Use  Smoking Status Former  Smokeless Tobacco Never    Allergies  Allergen Reactions   Hydrochlorothiazide     Other Reaction(s): has gout   Objective:  There were no vitals filed for this visit. There is no height or weight on file to calculate BMI. Constitutional Well developed. Well nourished.  Vascular Dorsalis pedis pulses palpable bilaterally. Posterior tibial pulses palpable bilaterally. Capillary refill normal to all digits.  No cyanosis or clubbing noted. Pedal hair growth normal.  Neurologic Normal speech. Oriented to person, place, and time. Epicritic sensation to light touch grossly present bilaterally.  Dermatologic Right fourth digit dusky change resolving.  No further duskiness noted no  further concern for gangrene noted.  Orthopedic: Normal joint ROM without pain or crepitus bilaterally. No visible deformities. No bony tenderness.   Radiographs: None Assessment:   1. Vascular abnormality   2. Toe gangrene (HCC)      Plan:  Patient was evaluated and treated and all questions answered.  Right fourth digit dusky/beginning gangrenous changes noted~improving - All questions and concerns were discussed with the patient extensive detail - Had angiogram done which shows 2 vessels to the flow via the peroneal and the PT.  The AT is Occluded -Clinically the patient has had much improvement of the fourth digit at this time and return to regular activities no restrictions and no further concern for gangrenous changes.  Slight duskiness noted but likely due to healing.  No follow-ups on file.

## 2024-07-20 ENCOUNTER — Encounter: Payer: Self-pay | Admitting: Cardiology

## 2024-08-18 ENCOUNTER — Other Ambulatory Visit (INDEPENDENT_AMBULATORY_CARE_PROVIDER_SITE_OTHER): Payer: Self-pay | Admitting: Nurse Practitioner

## 2024-08-18 ENCOUNTER — Other Ambulatory Visit (INDEPENDENT_AMBULATORY_CARE_PROVIDER_SITE_OTHER): Payer: Self-pay | Admitting: Vascular Surgery

## 2024-08-18 DIAGNOSIS — I739 Peripheral vascular disease, unspecified: Secondary | ICD-10-CM

## 2024-08-22 ENCOUNTER — Telehealth (INDEPENDENT_AMBULATORY_CARE_PROVIDER_SITE_OTHER): Payer: Self-pay

## 2024-08-22 ENCOUNTER — Ambulatory Visit (INDEPENDENT_AMBULATORY_CARE_PROVIDER_SITE_OTHER): Admitting: Vascular Surgery

## 2024-08-22 ENCOUNTER — Encounter (INDEPENDENT_AMBULATORY_CARE_PROVIDER_SITE_OTHER): Payer: Self-pay

## 2024-08-22 ENCOUNTER — Encounter (INDEPENDENT_AMBULATORY_CARE_PROVIDER_SITE_OTHER)

## 2024-08-22 ENCOUNTER — Other Ambulatory Visit (INDEPENDENT_AMBULATORY_CARE_PROVIDER_SITE_OTHER)

## 2024-08-22 NOTE — Telephone Encounter (Signed)
 Patient called into nurse after hour line wanting to reschedule due to not feeling well , please call and advise
